# Patient Record
Sex: Female | Born: 1960 | Race: Black or African American | Hispanic: No | Smoking: Current every day smoker
Health system: Southern US, Community
[De-identification: ages and names within clinical notes are randomized; demographics above are authoritative.]

## PROBLEM LIST (undated history)

## (undated) DIAGNOSIS — G4700 Insomnia, unspecified: Secondary | ICD-10-CM

## (undated) DIAGNOSIS — Z720 Tobacco use: Secondary | ICD-10-CM

## (undated) DIAGNOSIS — E119 Type 2 diabetes mellitus without complications: Secondary | ICD-10-CM

## (undated) DIAGNOSIS — F329 Major depressive disorder, single episode, unspecified: Secondary | ICD-10-CM

## (undated) DIAGNOSIS — E785 Hyperlipidemia, unspecified: Secondary | ICD-10-CM

## (undated) HISTORY — DX: Major depressive disorder, single episode, unspecified: F32.9

## (undated) HISTORY — DX: Insomnia, unspecified: G47.00

## (undated) HISTORY — DX: Tobacco use: Z72.0

## (undated) HISTORY — PX: BREAST CYST EXCISION: SHX579

## (undated) HISTORY — DX: Hyperlipidemia, unspecified: E78.5

## (undated) HISTORY — DX: Type 2 diabetes mellitus without complications: E11.9

---

## 1985-09-21 HISTORY — PX: CHOLECYSTECTOMY: SHX55

## 2010-07-16 ENCOUNTER — Ambulatory Visit (HOSPITAL_BASED_OUTPATIENT_CLINIC_OR_DEPARTMENT_OTHER): Admission: RE | Admit: 2010-07-16 | Discharge: 2010-07-16 | Payer: Self-pay | Admitting: Unknown Physician Specialty

## 2010-07-18 ENCOUNTER — Ambulatory Visit: Payer: Self-pay | Admitting: Radiology

## 2010-10-12 ENCOUNTER — Encounter: Payer: Self-pay | Admitting: Unknown Physician Specialty

## 2012-09-21 DIAGNOSIS — F32A Depression, unspecified: Secondary | ICD-10-CM

## 2012-09-21 DIAGNOSIS — F329 Major depressive disorder, single episode, unspecified: Secondary | ICD-10-CM | POA: Insufficient documentation

## 2012-09-21 DIAGNOSIS — E785 Hyperlipidemia, unspecified: Secondary | ICD-10-CM

## 2012-09-21 DIAGNOSIS — E119 Type 2 diabetes mellitus without complications: Secondary | ICD-10-CM

## 2012-09-21 HISTORY — DX: Type 2 diabetes mellitus without complications: E11.9

## 2012-09-21 HISTORY — DX: Depression, unspecified: F32.A

## 2012-09-21 HISTORY — DX: Hyperlipidemia, unspecified: E78.5

## 2013-12-29 DIAGNOSIS — E119 Type 2 diabetes mellitus without complications: Secondary | ICD-10-CM | POA: Insufficient documentation

## 2015-09-22 HISTORY — PX: REPLACEMENT TOTAL KNEE: SUR1224

## 2017-09-03 LAB — GLUCOSE, POCT (MANUAL RESULT ENTRY): POC Glucose: 138 mg/dl — AB (ref 70–99)

## 2017-10-12 ENCOUNTER — Ambulatory Visit: Payer: Self-pay | Admitting: Internal Medicine

## 2017-10-12 ENCOUNTER — Encounter: Payer: Self-pay | Admitting: Internal Medicine

## 2017-10-12 ENCOUNTER — Ambulatory Visit: Payer: Medicaid Other | Admitting: Internal Medicine

## 2017-10-12 VITALS — BP 144/92 | HR 68 | Resp 12 | Ht 65.0 in | Wt 155.0 lb

## 2017-10-12 DIAGNOSIS — Z5321 Procedure and treatment not carried out due to patient leaving prior to being seen by health care provider: Secondary | ICD-10-CM

## 2017-10-12 LAB — GLUCOSE, POCT (MANUAL RESULT ENTRY): POC GLUCOSE: 160 mg/dL — AB (ref 70–99)

## 2017-10-12 NOTE — Progress Notes (Unsigned)
LCSW completed new patient screening with pt. Pt reported that she is often down/depressed but it is due to her physical pain, not mental health. She shared that she has problems sleeping; LCSW provided feedback about good sleep habits. Pt shared that her only current stress is wondering if the government shutdown will affect her subsidized housing. LCSW suggested calling [employer] [employer] [facility] to see if they have info about this. Pt reported that she does not have any urgent needs at this time and is not interested in counseling.

## 2017-10-12 NOTE — Progress Notes (Signed)
Patient needed to leave for another appointment before she could be seen.   Evaluated by LCSW today, however.

## 2017-10-13 ENCOUNTER — Ambulatory Visit (INDEPENDENT_AMBULATORY_CARE_PROVIDER_SITE_OTHER): Payer: Self-pay | Admitting: Internal Medicine

## 2017-10-13 ENCOUNTER — Encounter: Payer: Self-pay | Admitting: Internal Medicine

## 2017-10-13 ENCOUNTER — Ambulatory Visit: Payer: Self-pay | Admitting: Internal Medicine

## 2017-10-13 VITALS — BP 150/100 | HR 78 | Resp 12 | Ht 65.0 in | Wt 156.0 lb

## 2017-10-13 DIAGNOSIS — G4700 Insomnia, unspecified: Secondary | ICD-10-CM

## 2017-10-13 DIAGNOSIS — E119 Type 2 diabetes mellitus without complications: Secondary | ICD-10-CM

## 2017-10-13 DIAGNOSIS — Z716 Tobacco abuse counseling: Secondary | ICD-10-CM

## 2017-10-13 DIAGNOSIS — F32A Depression, unspecified: Secondary | ICD-10-CM

## 2017-10-13 DIAGNOSIS — M150 Primary generalized (osteo)arthritis: Secondary | ICD-10-CM

## 2017-10-13 DIAGNOSIS — Z720 Tobacco use: Secondary | ICD-10-CM | POA: Insufficient documentation

## 2017-10-13 DIAGNOSIS — E1142 Type 2 diabetes mellitus with diabetic polyneuropathy: Secondary | ICD-10-CM

## 2017-10-13 DIAGNOSIS — K029 Dental caries, unspecified: Secondary | ICD-10-CM

## 2017-10-13 DIAGNOSIS — R030 Elevated blood-pressure reading, without diagnosis of hypertension: Secondary | ICD-10-CM

## 2017-10-13 DIAGNOSIS — E782 Mixed hyperlipidemia: Secondary | ICD-10-CM

## 2017-10-13 DIAGNOSIS — F329 Major depressive disorder, single episode, unspecified: Secondary | ICD-10-CM

## 2017-10-13 DIAGNOSIS — M159 Polyosteoarthritis, unspecified: Secondary | ICD-10-CM

## 2017-10-13 LAB — GLUCOSE, POCT (MANUAL RESULT ENTRY): POC Glucose: 170 mg/dl — AB (ref 70–99)

## 2017-10-13 MED ORDER — VARENICLINE TARTRATE 1 MG PO TABS: ORAL_TABLET | ORAL | 1 refills | Status: DC

## 2017-10-13 MED ORDER — TRAZODONE HCL 100 MG PO TABS: 100.0000 mg | ORAL_TABLET | Freq: Every day | ORAL | 11 refills | Status: AC

## 2017-10-13 MED ORDER — METFORMIN HCL 500 MG PO TABS: 500.0000 mg | ORAL_TABLET | Freq: Two times a day (BID) | ORAL | 11 refills | Status: DC

## 2017-10-13 MED ORDER — VARENICLINE TARTRATE 0.5 MG X 11 & 1 MG X 42 PO MISC: ORAL | 0 refills | Status: DC

## 2017-10-13 MED ORDER — ATORVASTATIN CALCIUM 40 MG PO TABS: 40.0000 mg | ORAL_TABLET | Freq: Every day | ORAL | 11 refills | Status: AC

## 2017-10-13 MED ORDER — TART CHERRY ADVANCED PO CAPS: ORAL_CAPSULE | ORAL | Status: DC

## 2017-10-13 MED ORDER — GABAPENTIN 100 MG PO CAPS: ORAL_CAPSULE | ORAL | 3 refills | Status: DC

## 2017-10-13 NOTE — Progress Notes (Signed)
Subjective:    Patient ID: Sheila Padilla, female    DOB: 1961-01-16, 57 y.o.   MRN: [id number]  HPI   Here to establish  1.  Elevated BP:  No history of elevated BP.  States usually runs 120/70.  She has been in a rush today and yesterday when initially was here.  Was on low dose Lisinopril at one point for renal protection.  No history of microalbuminuria, however.  Lisinopril was stopped following surgery as bp was too low.  2.  DM:  Diagnosed about 3-4 years ago.  Has only taken Metformin 500 mg twice daily with meals.  Came off Metformin last year when worked A1C down to 5.5%.  Follow up showed A1C of 6.1% in March of 2018, but ran out of insurance at that time.  Has not had a blood glucose check or A1C since.   Weight has been fairly stable since, though weighed in 140s on Metformin.   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     3.  Diffuse OA:  On disability.    4.  Hyperlipidemia:  Diagnosed also about 3-4 years ago.  Was on a statin.  Again, has not been on medication for some time.  5.  Tobacco abuse:  Smokes 1/2 ppd.  Started age 66.  Almost stopped completely last January with Chantix before insurance ran out.    6.  Insomnia:  History of using Trazodone 100 mg in past for sleep, but when out of insurance, could not fill and poor sleep since.  7.  Right shoulder pain:  Did go to pain clinic.  8.  Pain in MCP of thumb,  left hand.  Right handed.    9.  Depression and anxiety:  Fluoxetine and Hydroxyzine.  Was stressed about her daughter being in prison short term back in 2014.  Has not taken either med for almost a year now. Not interested in counseling at this time.  No outpatient medications have been marked as taking for the 10/13/17 encounter (Office Visit) with Mack Hook, MD.    No Known Allergies   Past Medical History:  Diagnosis Date  . Depression 2014   and anxiety  . Diabetes mellitus without complication ([place]) 7510  . Hyperlipidemia  2014  . Insomnia   . Tobacco abuse age 80    Past Surgical History:  Procedure Laterality Date  . BREAST CYST EXCISION Right teen   twice in teenage years  . CHOLECYSTECTOMY  1987   open  . REPLACEMENT TOTAL KNEE Left 2017       Review of Systems     Objective:   Physical Exam  NAD HEENT:  PERRL, EOMI, discs sharp, throat without injection.  Multiple missing teeth. Neck:  Supple, No adenopathy, no thyromegaly Chest:  CTA CV:  RRR with normal S1 and S2, No S3, S4 or murmur.  No carotid bruits.  Carotid, radial and DP pulses normal and equal MS:  Hypertrophic changes of most joints of hands in particular left MCP thumb joints bilaterally  L>R        Assessment & Plan:  1.  Elevated BP:  Recheck with fasting labs in the next week.  States has never been high previously  2.  DM:  Return in next week for labs as above.  A1C, CMP, urine microalbumin/crea.  Hold on picking up Metformin until we see A1C If requires treatment, will send glucometer and test strips Rx to [facility]. Immunizations up  to date. Eye referral  3.  Hyperlipidemia:  FLP in next week.  Atorvastatin to [facility] based on lipid panel.  4.  Insomnia:  Discussed healthy sleep hygiene, including regular physical activity and exposure to sunlight regularly.  Trazodone 100 mg at bedtime.  5.  History of Depression and Anxiety.  Encouraged calling Macie Burows, LCSW, who spoke with her yesterday.  She states she does not feel the need for counseling at this time.  Hold on restarting medication until we see how she does with current prescriptions.  6.  Tobacco cessation counseling:  Chantix starter kit and continuation.  Discussed getting rid of smoking paraphernalia on day 8 and stopping.  8.  Osteoarthritis:  Diffuse.  Discussed possibility of water activity to strengthen muscles and spare joints.  Also PT.  Not currently interested in the latter.  [facility] recommended.  9. Dental Decay:   Dental referral  10.  Diabetic peripheral neuropathy:  Brings up has this with feet at end of visit.  Did well with bedtime Gabapentin 300 mg.  Titrate up to that dose.  Fasting labs in 1 week OV with me in 2 months CPE in 4 months.

## 2017-10-13 NOTE — Patient Instructions (Addendum)
Drink a glass of water before every meal Drink 6-8 glasses of water daily Eat three meals daily Eat a protein and healthy fat with every meal (eggs,fish, chicken, [place]key and limit red meats) Eat 5 servings of vegetables daily, mix the colors Eat 2 servings of fruit daily with skin, if skin is edible Use smaller plates Put food/utensils down as you chew and swallow each bite Eat at a table with friends/family at least once daily, no TV Do not eat in front of the TV  Recent studies show that people who consume all of their calories in a 12 hour period lose weight more efficiently.  For example, if you eat your first meal at 7:00 a.m., your last meal of the day should be completed by 7:00 p.m.  Tobacco Cessation:   1800QUITNOW or [id number], the former for support and possibly free nicotine patches/gum and support; the latter for [facility] Smoking cessation class. Get rid of all smoking supplies:  Cigarettes, lighters, ashtrays--no stashes just in case at home if you are serious.  [facility] [address]  [place], [place] [address] [phone]

## 2017-10-21 ENCOUNTER — Other Ambulatory Visit: Payer: Self-pay

## 2017-10-26 ENCOUNTER — Other Ambulatory Visit: Payer: Medicaid Other

## 2017-10-26 DIAGNOSIS — E782 Mixed hyperlipidemia: Secondary | ICD-10-CM

## 2017-10-26 DIAGNOSIS — E119 Type 2 diabetes mellitus without complications: Secondary | ICD-10-CM

## 2017-10-26 DIAGNOSIS — Z79899 Other long term (current) drug therapy: Secondary | ICD-10-CM

## 2017-10-27 LAB — COMPREHENSIVE METABOLIC PANEL
A/G RATIO: 1.3 (ref 1.2–2.2)
ALT: 13 IU/L (ref 0–32)
AST: 22 IU/L (ref 0–40)
Albumin: 4.2 g/dL (ref 3.5–5.5)
Alkaline Phosphatase: 111 IU/L (ref 39–117)
BUN/Creatinine Ratio: 11 (ref 9–23)
BUN: 9 mg/dL (ref 6–24)
Bilirubin Total: 0.3 mg/dL (ref 0.0–1.2)
CHLORIDE: 103 mmol/L (ref 96–106)
CO2: 21 mmol/L (ref 20–29)
Calcium: 9.8 mg/dL (ref 8.7–10.2)
Creatinine, Ser: 0.81 mg/dL (ref 0.57–1.00)
GFR calc Af Amer: 95 mL/min/{1.73_m2} (ref >59)
GFR, EST NON AFRICAN AMERICAN: 83 mL/min/{1.73_m2} (ref >59)
GLUCOSE: 154 mg/dL — AB (ref 65–99)
Globulin, Total: 3.3 g/dL (ref 1.5–4.5)
POTASSIUM: 4.2 mmol/L (ref 3.5–5.2)
Sodium: 142 mmol/L (ref 134–144)
Total Protein: 7.5 g/dL (ref 6.0–8.5)

## 2017-10-27 LAB — CBC WITH DIFFERENTIAL/PLATELET
BASOS ABS: 0 10*3/uL (ref 0.0–0.2)
BASOS: 1 %
EOS (ABSOLUTE): 0.2 10*3/uL (ref 0.0–0.4)
Eos: 2 %
Hematocrit: 40.6 % (ref 34.0–46.6)
Hemoglobin: 13.2 g/dL (ref 11.1–15.9)
IMMATURE GRANS (ABS): 0 10*3/uL (ref 0.0–0.1)
IMMATURE GRANULOCYTES: 0 %
LYMPHS: 31 %
Lymphocytes Absolute: 2 10*3/uL (ref 0.7–3.1)
MCH: 29.6 pg (ref 26.6–33.0)
MCHC: 32.5 g/dL (ref 31.5–35.7)
MCV: 91 fL (ref 79–97)
Monocytes Absolute: 0.5 10*3/uL (ref 0.1–0.9)
Monocytes: 7 %
NEUTROS PCT: 59 %
Neutrophils Absolute: 3.8 10*3/uL (ref 1.4–7.0)
PLATELETS: 248 10*3/uL (ref 150–379)
RBC: 4.46 x10E6/uL (ref 3.77–5.28)
RDW: 13.6 % (ref 12.3–15.4)
WBC: 6.4 10*3/uL (ref 3.4–10.8)

## 2017-10-27 LAB — HGB A1C W/O EAG: Hgb A1c MFr Bld: 7.1 % — ABNORMAL HIGH (ref 4.8–5.6)

## 2017-10-27 LAB — MICROALBUMIN / CREATININE URINE RATIO
CREATININE, UR: 77.7 mg/dL
Microalbumin, Urine: <3 ug/mL

## 2017-10-27 LAB — LIPID PANEL W/O CHOL/HDL RATIO
CHOLESTEROL TOTAL: 243 mg/dL — AB (ref 100–199)
HDL: 65 mg/dL (ref >39)
LDL CALC: 121 mg/dL — AB (ref 0–99)
Triglycerides: 285 mg/dL — ABNORMAL HIGH (ref 0–149)
VLDL CHOLESTEROL CAL: 57 mg/dL — AB (ref 5–40)

## 2017-10-28 ENCOUNTER — Other Ambulatory Visit: Payer: Self-pay | Admitting: Internal Medicine

## 2017-10-28 MED ORDER — GLUCOSE BLOOD VI STRP: ORAL_STRIP | 12 refills | Status: AC

## 2017-10-28 MED ORDER — AGAMATRIX ULTRA-THIN LANCETS MISC: 11 refills | Status: AC

## 2017-10-28 MED ORDER — AGAMATRIX PRESTO W/DEVICE KIT: PACK | 0 refills | Status: AC

## 2017-10-28 NOTE — Progress Notes (Signed)
Ordering glucose monitoring equipment

## 2017-11-05 ENCOUNTER — Other Ambulatory Visit: Payer: Medicaid Other | Admitting: Licensed Clinical Social Worker

## 2017-11-27 ENCOUNTER — Encounter: Payer: Self-pay | Admitting: Internal Medicine

## 2017-12-13 ENCOUNTER — Ambulatory Visit: Payer: Self-pay | Admitting: Internal Medicine

## 2017-12-27 ENCOUNTER — Other Ambulatory Visit: Payer: Self-pay

## 2017-12-29 ENCOUNTER — Ambulatory Visit: Payer: Self-pay | Admitting: Internal Medicine

## 2018-01-25 ENCOUNTER — Ambulatory Visit: Payer: Self-pay | Admitting: Internal Medicine

## 2018-02-16 ENCOUNTER — Encounter: Payer: Self-pay | Admitting: Internal Medicine

## 2018-07-02 ENCOUNTER — Other Ambulatory Visit: Payer: Self-pay | Admitting: Internal Medicine

## 2018-07-13 ENCOUNTER — Emergency Department (HOSPITAL_COMMUNITY)
Admission: EM | Admit: 2018-07-13 | Discharge: 2018-07-13 | Disposition: A | Discharge status: Home / Self Care | Payer: Medicare Other | Attending: Emergency Medicine | Admitting: Emergency Medicine

## 2018-07-13 ENCOUNTER — Emergency Department (HOSPITAL_COMMUNITY): Payer: Medicare Other

## 2018-07-13 ENCOUNTER — Other Ambulatory Visit: Payer: Self-pay

## 2018-07-13 ENCOUNTER — Encounter (HOSPITAL_COMMUNITY): Payer: Self-pay | Admitting: Emergency Medicine

## 2018-07-13 DIAGNOSIS — R1084 Generalized abdominal pain: Secondary | ICD-10-CM

## 2018-07-13 DIAGNOSIS — E119 Type 2 diabetes mellitus without complications: Secondary | ICD-10-CM | POA: Insufficient documentation

## 2018-07-13 DIAGNOSIS — Z79899 Other long term (current) drug therapy: Secondary | ICD-10-CM | POA: Diagnosis not present

## 2018-07-13 DIAGNOSIS — N390 Urinary tract infection, site not specified: Secondary | ICD-10-CM

## 2018-07-13 DIAGNOSIS — F17210 Nicotine dependence, cigarettes, uncomplicated: Secondary | ICD-10-CM | POA: Insufficient documentation

## 2018-07-13 DIAGNOSIS — Z7984 Long term (current) use of oral hypoglycemic drugs: Secondary | ICD-10-CM | POA: Insufficient documentation

## 2018-07-13 LAB — URINALYSIS, ROUTINE W REFLEX MICROSCOPIC
Bilirubin Urine: NEGATIVE
Glucose, UA: NEGATIVE mg/dL
Hgb urine dipstick: NEGATIVE
Ketones, ur: 20 mg/dL — AB
Leukocytes, UA: NEGATIVE
Nitrite: POSITIVE — AB
PH: 6 (ref 5.0–8.0)
PROTEIN: NEGATIVE mg/dL
SPECIFIC GRAVITY, URINE: 1.018 (ref 1.005–1.030)

## 2018-07-13 LAB — COMPREHENSIVE METABOLIC PANEL
ALT: 18 U/L (ref 0–44)
AST: 22 U/L (ref 15–41)
Albumin: 4.5 g/dL (ref 3.5–5.0)
Alkaline Phosphatase: 73 U/L (ref 38–126)
Anion gap: 15 (ref 5–15)
BUN: 14 mg/dL (ref 6–20)
CHLORIDE: 97 mmol/L — AB (ref 98–111)
CO2: 25 mmol/L (ref 22–32)
Calcium: 9.9 mg/dL (ref 8.9–10.3)
Creatinine, Ser: 0.95 mg/dL (ref 0.44–1.00)
GFR calc Af Amer: >60 mL/min (ref >60)
GFR calc non Af Amer: >60 mL/min (ref >60)
GLUCOSE: 191 mg/dL — AB (ref 70–99)
Potassium: 3.5 mmol/L (ref 3.5–5.1)
Sodium: 137 mmol/L (ref 135–145)
Total Bilirubin: 0.9 mg/dL (ref 0.3–1.2)
Total Protein: 8.8 g/dL — ABNORMAL HIGH (ref 6.5–8.1)

## 2018-07-13 LAB — CBC
HEMATOCRIT: 45.1 % (ref 36.0–46.0)
Hemoglobin: 14.4 g/dL (ref 12.0–15.0)
MCH: 29.8 pg (ref 26.0–34.0)
MCHC: 31.9 g/dL (ref 30.0–36.0)
MCV: 93.2 fL (ref 80.0–100.0)
Platelets: 294 10*3/uL (ref 150–400)
RBC: 4.84 MIL/uL (ref 3.87–5.11)
RDW: 12.3 % (ref 11.5–15.5)
WBC: 14.1 10*3/uL — ABNORMAL HIGH (ref 4.0–10.5)
nRBC: 0 % (ref 0.0–0.2)

## 2018-07-13 LAB — LIPASE, BLOOD: LIPASE: 28 U/L (ref 11–51)

## 2018-07-13 MED ORDER — ONDANSETRON HCL 4 MG/2ML IJ SOLN
4.0000 mg | Freq: Once | INTRAMUSCULAR | Status: AC
  Administered 2018-07-13: 4 mg via INTRAVENOUS
  Filled 2018-07-13: qty 2

## 2018-07-13 MED ORDER — CEPHALEXIN 500 MG PO CAPS: 500.0000 mg | ORAL_CAPSULE | Freq: Two times a day (BID) | ORAL | 0 refills | Status: DC

## 2018-07-13 MED ORDER — HYDROMORPHONE HCL 1 MG/ML IJ SOLN
1.0000 mg | Freq: Once | INTRAMUSCULAR | Status: AC
  Administered 2018-07-13: 1 mg via INTRAVENOUS
  Filled 2018-07-13: qty 1

## 2018-07-13 MED ORDER — MORPHINE SULFATE (PF) 4 MG/ML IV SOLN
4.0000 mg | Freq: Once | INTRAVENOUS | Status: AC
  Administered 2018-07-13: 4 mg via INTRAVENOUS
  Filled 2018-07-13: qty 1

## 2018-07-13 MED ORDER — SODIUM CHLORIDE 0.9 % IV BOLUS
1000.0000 mL | Freq: Once | INTRAVENOUS | Status: AC
  Administered 2018-07-13: 1000 mL via INTRAVENOUS

## 2018-07-13 MED ORDER — SODIUM CHLORIDE 0.9 % IJ SOLN
INTRAMUSCULAR | Status: AC
  Filled 2018-07-13: qty 50

## 2018-07-13 MED ORDER — IOPAMIDOL (ISOVUE-300) INJECTION 61%
100.0000 mL | Freq: Once | INTRAVENOUS | Status: AC | PRN
  Administered 2018-07-13: 100 mL via INTRAVENOUS

## 2018-07-13 MED ORDER — SODIUM CHLORIDE 0.9 % IV SOLN
1.0000 g | Freq: Once | INTRAVENOUS | Status: AC
  Administered 2018-07-13: 1 g via INTRAVENOUS
  Filled 2018-07-13: qty 10

## 2018-07-13 MED ORDER — IOPAMIDOL (ISOVUE-300) INJECTION 61%
INTRAVENOUS | Status: AC
  Filled 2018-07-13: qty 100

## 2018-07-13 MED ORDER — ONDANSETRON 4 MG PO TBDP: 4.0000 mg | ORAL_TABLET | Freq: Three times a day (TID) | ORAL | 0 refills | Status: DC | PRN

## 2018-07-13 NOTE — ED Notes (Signed)
Pt given graham crackers and Ginger ale, encouraged to eat/drink.

## 2018-07-13 NOTE — ED Triage Notes (Signed)
Pt complaint of body aches, n/v/d, cough onset this morning. Concern "sick or withdrawal from my oxycodone." Pt verbalizes prescribed oxycodone every 6 hours for past 2 months; last dose yesterday evening.

## 2018-07-13 NOTE — ED Provider Notes (Signed)
[facility] DEPT Provider Note   CSN: [id number] Arrival date & time: 07/13/18  1202     History   Chief Complaint Chief Complaint  Patient presents with  . Emesis  . Diarrhea  . Generalized Body Aches  . Cough    HPI Sheila Padilla is a 57 y.o. female.  The history is provided by the patient. No language interpreter was used.   Sheila Padilla is a 57 y.o. female who presents to the Emergency Department complaining of abdominal pain, vomiting. He presents to the emergency department for evaluation of abdominal pain and vomiting that started today. She began feeling poorly last night with nausea and malaise. This morning when she awoke she had generalized abdominal pain with numerous episodes of emesis. She did have to loose stools. She has a history of chronic pain and has been taking Percocet 5, 3 to 4 times daily for the last two months. Her last dose of Percocet was 2 PM yesterday. Her refill is not due until tomorrow. She denies any fevers but does feel hot at times. No dysuria. No known sick contacts or bad food exposures. Past Medical History:  Diagnosis Date  . Depression 2014   and anxiety  . Diabetes mellitus without complication ([place]) 1552  . Hyperlipidemia 2014  . Insomnia   . Tobacco abuse age 34    Patient Active Problem List   Diagnosis Date Noted  . Insomnia   . Tobacco abuse   . Type II diabetes mellitus ([place]) 12/29/2013  . Diabetes mellitus without complication ([place]) 04/23/2335  . Hyperlipidemia 09/21/2012  . Depression 09/21/2012    Past Surgical History:  Procedure Laterality Date  . BREAST CYST EXCISION Right teen   twice in teenage years  . CHOLECYSTECTOMY  1987   open  . REPLACEMENT TOTAL KNEE Left 2017     OB History   None      Home Medications    Prior to Admission medications   Medication Sig Start Date End Date Taking? Authorizing Provider  atorvastatin (LIPITOR) 40 MG tablet Take 1 tablet (40 mg  total) by mouth daily. 10/13/17  Yes Mack Hook, MD  celecoxib (CELEBREX) 200 MG capsule Take 200 mg by mouth daily as needed for mild pain.  07/12/18  Yes [provider]  gabapentin (NEURONTIN) 300 MG capsule Take 600 mg by mouth at bedtime as needed (nerve pain).  07/07/18  Yes [provider]  hydrOXYzine (ATARAX/VISTARIL) 25 MG tablet Take 25 mg by mouth every 8 (eight) hours as needed for anxiety.  07/05/18  Yes [provider]  metFORMIN (GLUCOPHAGE) 500 MG tablet TAKE 1 TABLET BY MOUTH TWICE DAILY WITH A MEAL Patient taking differently: Take 500 mg by mouth 2 (two) times daily with a meal.  07/03/18  Yes Mack Hook, MD  omeprazole (PRILOSEC) 20 MG capsule Take 20 mg by mouth daily as needed (heartburn).  07/12/18  Yes [provider]  oxyCODONE-acetaminophen (PERCOCET/ROXICET) 5-325 MG tablet Take 1 tablet by mouth every 12 (twelve) hours as needed for moderate pain.  05/19/18  Yes [provider]  traZODone (DESYREL) 100 MG tablet Take 1 tablet (100 mg total) by mouth at bedtime. 10/13/17  Yes Mack Hook, MD  Turmeric 500 MG TABS Take 1,000 mg by mouth daily.   Yes [provider]  Leandra Kern LANCETS MISC Check blood glucose twice daily before meals 10/28/17   Mack Hook, MD  Blood Glucose Monitoring Suppl (AGAMATRIX PRESTO) w/Device KIT Check  blood glucose twice daily before meals 10/28/17   Mack Hook, MD  gabapentin (NEURONTIN) 100 MG capsule 1 cap by mouth at bedtime and increased by 1 cap every 3 days until taking 3 caps at bedtime. Patient not taking: Reported on 07/13/2018 10/13/17   Mack Hook, MD  glucose blood (AGAMATRIX PRESTO TEST) test strip Check blood glucose twice daily before meals 10/28/17   Mack Hook, MD  Misc Natural Products [facility] - Eat ADVANCED) CAPS Take as directed by [facility] for arthritis pain Patient not taking: Reported on 07/13/2018  10/13/17   Mack Hook, MD  varenicline (CHANTIX STARTING MONTH PAK) 0.5 MG X 11 & 1 MG X 42 tablet Take one 0.5 mg tablet by mouth once daily for 3 days, then increase to one 0.5 mg tablet twice daily for 4 days, then increase to one 1 mg tablet twice daily. Patient not taking: Reported on 07/13/2018 10/13/17   Mack Hook, MD  varenicline (CHANTIX) 1 MG tablet 1 tab by mouth twice daily after completing starter pack Patient not taking: Reported on 07/13/2018 10/13/17   Mack Hook, MD    Family History No family history on file.  Social History Social History   Tobacco Use  . Smoking status: Current Every Day Smoker    Packs/day: 0.50    Years: 38.00    Pack years: 19.00    Types: Cigarettes  . Smokeless tobacco: Never Used  Substance Use Topics  . Alcohol use: Not on file  . Drug use: Not on file     Allergies   Patient has no known allergies.   Review of Systems Review of Systems  All other systems reviewed and are negative.    Physical Exam Updated Vital Signs BP (!) 153/87   Pulse (!) 104   Temp 98 F (36.7 C) (Oral)   Resp 18   LMP  (LMP Unknown)   SpO2 100%   Physical Exam  Constitutional: She is oriented to person, place, and time. She appears well-developed and well-nourished.  Uncomfortable appearing  HENT:  Head: Normocephalic and atraumatic.  Cardiovascular: Normal rate and regular rhythm.  No murmur heard. Pulmonary/Chest: Effort normal and breath sounds normal. No respiratory distress.  Abdominal: Soft. There is no rebound and no guarding.  Mild generalized abdominal tenderness, greatest over the lower quadrant bilaterally  Musculoskeletal: She exhibits no edema or tenderness.  Neurological: She is alert and oriented to person, place, and time.  Skin: Skin is warm and dry.  Psychiatric: She has a normal mood and affect. Her behavior is normal.  Nursing note and vitals reviewed.    ED Treatments / Results  Labs (all  labs ordered are listed, but only abnormal results are displayed) Labs Reviewed  COMPREHENSIVE METABOLIC PANEL - Abnormal; Notable for the following components:      Result Value   Chloride 97 (*)    Glucose, Bld 191 (*)    Total Protein 8.8 (*)    All other components within normal limits  CBC - Abnormal; Notable for the following components:   WBC 14.1 (*)    All other components within normal limits  URINALYSIS, ROUTINE W REFLEX MICROSCOPIC - Abnormal; Notable for the following components:   APPearance HAZY (*)    Ketones, ur 20 (*)    Nitrite POSITIVE (*)    Bacteria, UA MANY (*)    All other components within normal limits  URINE CULTURE  LIPASE, BLOOD    EKG None  Radiology Ct Abdomen Pelvis  W Contrast  Result Date: 07/13/2018 CLINICAL DATA:  Generalized abdominal pain with fever nausea and vomiting EXAM: CT ABDOMEN AND PELVIS WITH CONTRAST TECHNIQUE: Multidetector CT imaging of the abdomen and pelvis was performed using the standard protocol following bolus administration of intravenous contrast. CONTRAST:  136m ISOVUE-300 IOPAMIDOL (ISOVUE-300) INJECTION 61% COMPARISON:  09/05/2015 FINDINGS: Lower chest: Lung bases demonstrate no acute consolidation or effusion. Heart size is normal. Hepatobiliary: No focal liver abnormality is seen. Status post cholecystectomy. No biliary dilatation. Pancreas: Unremarkable. No pancreatic ductal dilatation or surrounding inflammatory changes. Spleen: Normal in size without focal abnormality. Adrenals/Urinary Tract: Adrenal glands are unremarkable. Kidneys are normal, without renal calculi, focal lesion, or hydronephrosis. Bladder is unremarkable. Stomach/Bowel: Stomach is within normal limits. Appendix not well seen but no right lower quadrant inflammatory process. No evidence of bowel wall thickening, distention, or inflammatory changes. Sigmoid colon diverticular disease without acute inflammatory process. Vascular/Lymphatic: No significant  vascular findings are present. No enlarged abdominal or pelvic lymph nodes. Reproductive: Status post hysterectomy. No adnexal masses. Other: Negative for free air or free fluid. Musculoskeletal: Grade 1 anterolisthesis L4 on L5. No acute or suspicious abnormality. IMPRESSION: 1. No CT evidence for acute intra-abdominal or pelvic abnormality. 2. Sigmoid colon diverticular disease without acute inflammation Electronically Signed   By: KDonavan FoilM.D.   On: 07/13/2018 15:21    Procedures Procedures (including critical care time)  Medications Ordered in ED Medications  iopamidol (ISOVUE-300) 61 % injection (has no administration in time range)  sodium chloride 0.9 % injection (has no administration in time range)  cefTRIAXone (ROCEPHIN) 1 g in sodium chloride 0.9 % 100 mL IVPB (1 g Intravenous New Bag/Given 07/13/18 1640)  sodium chloride 0.9 % bolus 1,000 mL (0 mLs Intravenous Stopped 07/13/18 1548)  ondansetron (ZOFRAN) injection 4 mg (4 mg Intravenous Given 07/13/18 1358)  morphine 4 MG/ML injection 4 mg (4 mg Intravenous Given 07/13/18 1359)  iopamidol (ISOVUE-300) 61 % injection 100 mL (100 mLs Intravenous Contrast Given 07/13/18 1450)  HYDROmorphone (DILAUDID) injection 1 mg (1 mg Intravenous Given 07/13/18 1635)  ondansetron (ZOFRAN) injection 4 mg (4 mg Intravenous Given 07/13/18 1634)     Initial Impression / Assessment and Plan / ED Course  I have reviewed the triage vital signs and the nursing notes.  Pertinent labs & imaging results that were available during my care of the patient were reviewed by me and considered in my medical decision making (see chart for details).     Patient here for evaluation of abdominal pain, nausea, vomiting. She does have a urinalysis that is concerning for UTI. Will treat with antibiotics in setting of her symptoms. She recently ran out of her Percocet, there may be an element of narcotic withdrawal as well. She was treated with IV pain  medications, antiemetics and IV fluids. CT abdomen is negative for acute pathology. If patient is feeling improved following medications plan to discharge home with outpatient follow-up. If she has persistent vomiting she may need admission for IV antibiotics for UTI. Patient care transferred pending medications.  Final Clinical Impressions(s) / ED Diagnoses   Final diagnoses:  None    ED Discharge Orders    None       RQuintella Reichert MD 07/13/18 [phone]

## 2018-07-13 NOTE — ED Notes (Signed)
Pt tolerating food and fluids, no complaints.

## 2018-07-15 ENCOUNTER — Emergency Department (HOSPITAL_COMMUNITY)
Admission: EM | Admit: 2018-07-15 | Discharge: 2018-07-15 | Disposition: A | Discharge status: Home / Self Care | Payer: Medicare Other | Attending: Emergency Medicine | Admitting: Emergency Medicine

## 2018-07-15 ENCOUNTER — Encounter (HOSPITAL_COMMUNITY): Payer: Self-pay

## 2018-07-15 DIAGNOSIS — F17210 Nicotine dependence, cigarettes, uncomplicated: Secondary | ICD-10-CM | POA: Insufficient documentation

## 2018-07-15 DIAGNOSIS — Z7984 Long term (current) use of oral hypoglycemic drugs: Secondary | ICD-10-CM | POA: Insufficient documentation

## 2018-07-15 DIAGNOSIS — N390 Urinary tract infection, site not specified: Secondary | ICD-10-CM | POA: Diagnosis not present

## 2018-07-15 DIAGNOSIS — R112 Nausea with vomiting, unspecified: Secondary | ICD-10-CM | POA: Insufficient documentation

## 2018-07-15 DIAGNOSIS — Z79899 Other long term (current) drug therapy: Secondary | ICD-10-CM | POA: Diagnosis not present

## 2018-07-15 DIAGNOSIS — E119 Type 2 diabetes mellitus without complications: Secondary | ICD-10-CM | POA: Diagnosis not present

## 2018-07-15 DIAGNOSIS — Z96652 Presence of left artificial knee joint: Secondary | ICD-10-CM | POA: Insufficient documentation

## 2018-07-15 DIAGNOSIS — R1010 Upper abdominal pain, unspecified: Secondary | ICD-10-CM | POA: Diagnosis present

## 2018-07-15 LAB — CBC WITH DIFFERENTIAL/PLATELET
ABS IMMATURE GRANULOCYTES: 0.04 10*3/uL (ref 0.00–0.07)
BASOS PCT: 0 %
Basophils Absolute: 0 10*3/uL (ref 0.0–0.1)
Eosinophils Absolute: 0 10*3/uL (ref 0.0–0.5)
Eosinophils Relative: 0 %
HCT: 42.2 % (ref 36.0–46.0)
Hemoglobin: 13.9 g/dL (ref 12.0–15.0)
IMMATURE GRANULOCYTES: 0 %
Lymphocytes Relative: 22 %
Lymphs Abs: 2 10*3/uL (ref 0.7–4.0)
MCH: 29.8 pg (ref 26.0–34.0)
MCHC: 32.9 g/dL (ref 30.0–36.0)
MCV: 90.6 fL (ref 80.0–100.0)
Monocytes Absolute: 0.7 10*3/uL (ref 0.1–1.0)
Monocytes Relative: 8 %
NEUTROS ABS: 6.3 10*3/uL (ref 1.7–7.7)
NEUTROS PCT: 70 %
PLATELETS: 264 10*3/uL (ref 150–400)
RBC: 4.66 MIL/uL (ref 3.87–5.11)
RDW: 12.6 % (ref 11.5–15.5)
WBC: 9 10*3/uL (ref 4.0–10.5)
nRBC: 0 % (ref 0.0–0.2)

## 2018-07-15 LAB — BASIC METABOLIC PANEL
ANION GAP: 14 (ref 5–15)
BUN: 13 mg/dL (ref 6–20)
CALCIUM: 10 mg/dL (ref 8.9–10.3)
CO2: 21 mmol/L — ABNORMAL LOW (ref 22–32)
CREATININE: 0.79 mg/dL (ref 0.44–1.00)
Chloride: 101 mmol/L (ref 98–111)
GFR calc Af Amer: >60 mL/min (ref >60)
GLUCOSE: 132 mg/dL — AB (ref 70–99)
Potassium: 3.7 mmol/L (ref 3.5–5.1)
SODIUM: 136 mmol/L (ref 135–145)

## 2018-07-15 LAB — URINE CULTURE: Culture: >=100000 — AB

## 2018-07-15 MED ORDER — DIPHENHYDRAMINE HCL 50 MG/ML IJ SOLN
25.0000 mg | Freq: Once | INTRAMUSCULAR | Status: AC
  Administered 2018-07-15: 25 mg via INTRAVENOUS
  Filled 2018-07-15: qty 1

## 2018-07-15 MED ORDER — ONDANSETRON HCL 4 MG/2ML IJ SOLN
4.0000 mg | Freq: Once | INTRAMUSCULAR | Status: AC
  Administered 2018-07-15: 4 mg via INTRAVENOUS
  Filled 2018-07-15: qty 2

## 2018-07-15 MED ORDER — SODIUM CHLORIDE 0.9 % IV SOLN
1.0000 g | Freq: Once | INTRAVENOUS | Status: AC
  Administered 2018-07-15: 1 g via INTRAVENOUS
  Filled 2018-07-15: qty 10

## 2018-07-15 MED ORDER — HYDROMORPHONE HCL 1 MG/ML IJ SOLN
1.0000 mg | Freq: Once | INTRAMUSCULAR | Status: AC
  Administered 2018-07-15: 1 mg via INTRAVENOUS
  Filled 2018-07-15: qty 1

## 2018-07-15 MED ORDER — PROMETHAZINE HCL 25 MG RE SUPP: 25.0000 mg | Freq: Four times a day (QID) | RECTAL | 0 refills | Status: DC | PRN

## 2018-07-15 MED ORDER — SODIUM CHLORIDE 0.9 % IV BOLUS
1000.0000 mL | Freq: Once | INTRAVENOUS | Status: AC
  Administered 2018-07-15: 1000 mL via INTRAVENOUS

## 2018-07-15 MED ORDER — METOCLOPRAMIDE HCL 5 MG/ML IJ SOLN
10.0000 mg | Freq: Once | INTRAMUSCULAR | Status: AC
  Administered 2018-07-15: 10 mg via INTRAVENOUS
  Filled 2018-07-15: qty 2

## 2018-07-15 MED ORDER — METOCLOPRAMIDE HCL 10 MG PO TABS: 10.0000 mg | ORAL_TABLET | Freq: Four times a day (QID) | ORAL | 0 refills | Status: DC | PRN

## 2018-07-15 NOTE — ED Triage Notes (Signed)
Pt arrived from home via [facility] with complaints of lower abdominal pain. Pt evaluated here and diagnosed with a UTI. Pt states has had no relief and constant nausea.

## 2018-07-15 NOTE — ED Provider Notes (Signed)
[facility] DEPT Provider Note   CSN: [id number] Arrival date & time: 07/15/18  2248     History   Chief Complaint Chief Complaint  Patient presents with  . Urinary Tract Infection    HPI Sheila Padilla is a 57 y.o. female.  Patient presents with continued abdominal pain with nausea and vomiting today.  She was seen in the ER yesterday with similar complaints.  She did have diarrhea at that time as well, reports that the diarrhea has stopped.  Patient was out of her oxycodone yesterday, but did get a prescribed today and has taken it.  It has not helped the pain.  Patient reports that she has intermittent spasms of pain across the upper abdomen, like labor pains.  She reports that she burps first and then vomits.     Past Medical History:  Diagnosis Date  . Depression 2014   and anxiety  . Diabetes mellitus without complication ([place]) 2500  . Hyperlipidemia 2014  . Insomnia   . Tobacco abuse age 64    Patient Active Problem List   Diagnosis Date Noted  . Insomnia   . Tobacco abuse   . Type II diabetes mellitus ([place]) 12/29/2013  . Diabetes mellitus without complication ([place]) 37/12/8887  . Hyperlipidemia 09/21/2012  . Depression 09/21/2012    Past Surgical History:  Procedure Laterality Date  . BREAST CYST EXCISION Right teen   twice in teenage years  . CHOLECYSTECTOMY  1987   open  . REPLACEMENT TOTAL KNEE Left 2017     OB History   None      Home Medications    Prior to Admission medications   Medication Sig Start Date End Date Taking? Authorizing Provider  atorvastatin (LIPITOR) 40 MG tablet Take 1 tablet (40 mg total) by mouth daily. 10/13/17  Yes Mack Hook, MD  celecoxib (CELEBREX) 200 MG capsule Take 200 mg by mouth daily as needed for mild pain.  07/12/18  Yes [provider]  cephALEXin (KEFLEX) 500 MG capsule Take 1 capsule (500 mg total) by mouth 2 (two) times daily. 07/13/18  Yes Quintella Reichert,  MD  gabapentin (NEURONTIN) 300 MG capsule Take 600 mg by mouth at bedtime as needed (nerve pain).  07/07/18  Yes [provider]  hydrOXYzine (ATARAX/VISTARIL) 25 MG tablet Take 25 mg by mouth every 8 (eight) hours as needed for anxiety.  07/05/18  Yes [provider]  metFORMIN (GLUCOPHAGE) 500 MG tablet TAKE 1 TABLET BY MOUTH TWICE DAILY WITH A MEAL Patient taking differently: Take 500 mg by mouth 2 (two) times daily with a meal.  07/03/18  Yes Mack Hook, MD  omeprazole (PRILOSEC) 20 MG capsule Take 20 mg by mouth daily as needed (heartburn).  07/12/18  Yes [provider]  ondansetron (ZOFRAN ODT) 4 MG disintegrating tablet Take 1 tablet (4 mg total) by mouth every 8 (eight) hours as needed for nausea or vomiting. 07/13/18  Yes Quintella Reichert, MD  oxyCODONE-acetaminophen (PERCOCET/ROXICET) 5-325 MG tablet Take 1 tablet by mouth every 12 (twelve) hours as needed for moderate pain.  05/19/18  Yes [provider]  traZODone (DESYREL) 100 MG tablet Take 1 tablet (100 mg total) by mouth at bedtime. 10/13/17  Yes Mack Hook, MD  Turmeric 500 MG TABS Take 1,000 mg by mouth daily.   Yes [provider]  Leandra Kern LANCETS MISC Check blood glucose twice daily before meals 10/28/17   Mack Hook, MD  Blood Glucose Monitoring Suppl (AGAMATRIX PRESTO)  w/Device KIT Check blood glucose twice daily before meals 10/28/17   Mack Hook, MD  gabapentin (NEURONTIN) 100 MG capsule 1 cap by mouth at bedtime and increased by 1 cap every 3 days until taking 3 caps at bedtime. Patient not taking: Reported on 07/13/2018 10/13/17   Mack Hook, MD  glucose blood (AGAMATRIX PRESTO TEST) test strip Check blood glucose twice daily before meals 10/28/17   Mack Hook, MD  metoCLOPramide (REGLAN) 10 MG tablet Take 1 tablet (10 mg total) by mouth every 6 (six) hours as needed for nausea or vomiting (nausea/headache). 07/15/18    Orpah Greek, MD  Misc Natural Products [facility] ADVANCED) CAPS Take as directed by [employer] for arthritis pain Patient not taking: Reported on 07/13/2018 10/13/17   Mack Hook, MD  promethazine (PHENERGAN) 25 MG suppository Place 1 suppository (25 mg total) rectally every 6 (six) hours as needed for nausea or vomiting. 07/15/18   Orpah Greek, MD  varenicline (CHANTIX STARTING MONTH PAK) 0.5 MG X 11 & 1 MG X 42 tablet Take one 0.5 mg tablet by mouth once daily for 3 days, then increase to one 0.5 mg tablet twice daily for 4 days, then increase to one 1 mg tablet twice daily. Patient not taking: Reported on 07/13/2018 10/13/17   Mack Hook, MD  varenicline (CHANTIX) 1 MG tablet 1 tab by mouth twice daily after completing starter pack Patient not taking: Reported on 07/13/2018 10/13/17   Mack Hook, MD    Family History No family history on file.  Social History Social History   Tobacco Use  . Smoking status: Current Every Day Smoker    Packs/day: 0.50    Years: 38.00    Pack years: 19.00    Types: Cigarettes  . Smokeless tobacco: Never Used  Substance Use Topics  . Alcohol use: Not on file  . Drug use: Not on file     Allergies   Patient has no known allergies.   Review of Systems Review of Systems  Gastrointestinal: Positive for abdominal pain, nausea and vomiting.  All other systems reviewed and are negative.    Physical Exam Updated Vital Signs BP (!) 157/84 (BP Location: Left Arm)   Pulse 82   Temp 98.3 F (36.8 C) (Oral)   Resp 18   LMP  (LMP Unknown)   SpO2 100%   Physical Exam  Constitutional: She is oriented to person, place, and time. She appears well-developed and well-nourished. No distress.  HENT:  Head: Normocephalic and atraumatic.  Right Ear: Hearing normal.  Left Ear: Hearing normal.  Nose: Nose normal.  Mouth/Throat: Oropharynx is clear and moist and mucous membranes are normal.    Eyes: Pupils are equal, round, and reactive to light. Conjunctivae and EOM are normal.  Neck: Normal range of motion. Neck supple.  Cardiovascular: Regular rhythm, S1 normal and S2 normal. Exam reveals no gallop and no friction rub.  No murmur heard. Pulmonary/Chest: Effort normal and breath sounds normal. No respiratory distress. She exhibits no tenderness.  Abdominal: Soft. Normal appearance and bowel sounds are normal. There is no hepatosplenomegaly. There is tenderness in the right upper quadrant, epigastric area, periumbilical area and left upper quadrant. There is no rebound, no guarding, no tenderness at McBurney's point and negative Murphy's sign. No hernia.  Musculoskeletal: Normal range of motion.  Neurological: She is alert and oriented to person, place, and time. She has normal strength. No cranial nerve deficit or sensory deficit. Coordination normal. GCS eye subscore  is 4. GCS verbal subscore is 5. GCS motor subscore is 6.  Skin: Skin is warm, dry and intact. No rash noted. No cyanosis.  Psychiatric: She has a normal mood and affect. Her speech is normal and behavior is normal. Thought content normal.  Nursing note and vitals reviewed.    ED Treatments / Results  Labs (all labs ordered are listed, but only abnormal results are displayed) Labs Reviewed  BASIC METABOLIC PANEL - Abnormal; Notable for the following components:      Result Value   CO2 21 (*)    Glucose, Bld 132 (*)    All other components within normal limits  CBC WITH DIFFERENTIAL/PLATELET  URINALYSIS, ROUTINE W REFLEX MICROSCOPIC  RAPID URINE DRUG SCREEN, HOSP PERFORMED    EKG None  Radiology Ct Abdomen Pelvis W Contrast  Result Date: 07/13/2018 CLINICAL DATA:  Generalized abdominal pain with fever nausea and vomiting EXAM: CT ABDOMEN AND PELVIS WITH CONTRAST TECHNIQUE: Multidetector CT imaging of the abdomen and pelvis was performed using the standard protocol following bolus administration of  intravenous contrast. CONTRAST:  134m ISOVUE-300 IOPAMIDOL (ISOVUE-300) INJECTION 61% COMPARISON:  09/05/2015 FINDINGS: Lower chest: Lung bases demonstrate no acute consolidation or effusion. Heart size is normal. Hepatobiliary: No focal liver abnormality is seen. Status post cholecystectomy. No biliary dilatation. Pancreas: Unremarkable. No pancreatic ductal dilatation or surrounding inflammatory changes. Spleen: Normal in size without focal abnormality. Adrenals/Urinary Tract: Adrenal glands are unremarkable. Kidneys are normal, without renal calculi, focal lesion, or hydronephrosis. Bladder is unremarkable. Stomach/Bowel: Stomach is within normal limits. Appendix not well seen but no right lower quadrant inflammatory process. No evidence of bowel wall thickening, distention, or inflammatory changes. Sigmoid colon diverticular disease without acute inflammatory process. Vascular/Lymphatic: No significant vascular findings are present. No enlarged abdominal or pelvic lymph nodes. Reproductive: Status post hysterectomy. No adnexal masses. Other: Negative for free air or free fluid. Musculoskeletal: Grade 1 anterolisthesis L4 on L5. No acute or suspicious abnormality. IMPRESSION: 1. No CT evidence for acute intra-abdominal or pelvic abnormality. 2. Sigmoid colon diverticular disease without acute inflammation Electronically Signed   By: KDonavan FoilM.D.   On: 07/13/2018 15:21    Procedures Procedures (including critical care time)  Medications Ordered in ED Medications  sodium chloride 0.9 % bolus 1,000 mL (0 mLs Intravenous Stopped 07/15/18 0533)  ondansetron (ZOFRAN) injection 4 mg (4 mg Intravenous Given 07/15/18 0432)  metoCLOPramide (REGLAN) injection 10 mg (10 mg Intravenous Given 07/15/18 0433)  diphenhydrAMINE (BENADRYL) injection 25 mg (25 mg Intravenous Given 07/15/18 0433)  cefTRIAXone (ROCEPHIN) 1 g in sodium chloride 0.9 % 100 mL IVPB (0 g Intravenous Stopped 07/15/18 0523)     Initial  Impression / Assessment and Plan / ED Course  I have reviewed the triage vital signs and the nursing notes.  Pertinent labs & imaging results that were available during my care of the patient were reviewed by me and considered in my medical decision making (see chart for details).     Patient presents to the emergency department for evaluation of persistent abdominal pain, nausea and vomiting.  She was seen in the emergency department 1 day ago with similar symptoms.  Her work-up revealed evidence of urinary tract infection, but otherwise was negative.  This included a CT scan.  She therefore does not require repeat imaging.  Patient's urine culture is growing E. coli, sensitivities still pending.  She was given an additional IV Rocephin here in the ER.  I believe that her vomiting, however, is  multifactorial.  She is not experiencing any back pain, does not have any CVA tenderness.  I do not think that this is pyelonephritis.  Patient was without her pain medication for 1 day which began with nausea and vomiting.  She also has a history of diabetes, likely some gastroparesis.  Patient provided IV fluids, antiemetics, analgesia and has significantly improved.  I do not feel that the patient requires hospitalization at this time.  Final Clinical Impressions(s) / ED Diagnoses   Final diagnoses:  Lower urinary tract infectious disease  Non-intractable vomiting with nausea, unspecified vomiting type    ED Discharge Orders         Ordered    metoCLOPramide (REGLAN) 10 MG tablet  Every 6 hours PRN     07/15/18 0615    promethazine (PHENERGAN) 25 MG suppository  Every 6 hours PRN     07/15/18 0615           Orpah Greek, MD 07/15/18 [phone]

## 2018-07-16 ENCOUNTER — Telehealth: Payer: Self-pay

## 2018-07-16 NOTE — Telephone Encounter (Signed)
Post ED Visit - Positive Culture Follow-up  Culture report reviewed by antimicrobial stewardship pharmacist:  []  Enzo Bi, Pharm.D. []  Celedonio Miyamoto, Pharm.D., BCPS AQ-ID []  Garvin Fila, Pharm.D., BCPS []  Georgina Pillion, Pharm.D., BCPS []  [place], [place].D., BCPS, AAHIVP []  Estella Husk, Pharm.D., BCPS, AAHIVP []  Lysle Pearl, PharmD, BCPS []  Phillips Climes, PharmD, BCPS []  Agapito Games, PharmD, BCPS []  Verlan Friends, PharmD [facility] Pharm D Positive urine culture Treated with Cephalexin, organism sensitive to the same and no further patient follow-up is required at this time.  Jerry Caras 07/16/2018, 11:06 AM

## 2018-08-08 ENCOUNTER — Other Ambulatory Visit: Payer: Self-pay

## 2018-08-08 ENCOUNTER — Encounter (HOSPITAL_COMMUNITY): Payer: Self-pay | Admitting: Emergency Medicine

## 2018-08-08 ENCOUNTER — Emergency Department (HOSPITAL_COMMUNITY): Payer: Medicare Other

## 2018-08-08 ENCOUNTER — Emergency Department (HOSPITAL_COMMUNITY)
Admission: EM | Admit: 2018-08-08 | Discharge: 2018-08-08 | Disposition: A | Discharge status: Home / Self Care | Payer: Medicare Other | Attending: Emergency Medicine | Admitting: Emergency Medicine

## 2018-08-08 DIAGNOSIS — Z79899 Other long term (current) drug therapy: Secondary | ICD-10-CM | POA: Insufficient documentation

## 2018-08-08 DIAGNOSIS — F17210 Nicotine dependence, cigarettes, uncomplicated: Secondary | ICD-10-CM | POA: Insufficient documentation

## 2018-08-08 DIAGNOSIS — N3000 Acute cystitis without hematuria: Secondary | ICD-10-CM | POA: Diagnosis not present

## 2018-08-08 DIAGNOSIS — R1010 Upper abdominal pain, unspecified: Secondary | ICD-10-CM

## 2018-08-08 DIAGNOSIS — E119 Type 2 diabetes mellitus without complications: Secondary | ICD-10-CM | POA: Diagnosis not present

## 2018-08-08 DIAGNOSIS — E785 Hyperlipidemia, unspecified: Secondary | ICD-10-CM | POA: Diagnosis not present

## 2018-08-08 DIAGNOSIS — R112 Nausea with vomiting, unspecified: Secondary | ICD-10-CM

## 2018-08-08 LAB — COMPREHENSIVE METABOLIC PANEL
ALT: 16 U/L (ref 0–44)
AST: 23 U/L (ref 15–41)
Albumin: 4.9 g/dL (ref 3.5–5.0)
Alkaline Phosphatase: 76 U/L (ref 38–126)
Anion gap: 14 (ref 5–15)
BUN: 17 mg/dL (ref 6–20)
CO2: 26 mmol/L (ref 22–32)
Calcium: 10.1 mg/dL (ref 8.9–10.3)
Chloride: 97 mmol/L — ABNORMAL LOW (ref 98–111)
Creatinine, Ser: 0.7 mg/dL (ref 0.44–1.00)
GFR calc Af Amer: >60 mL/min (ref >60)
GFR calc non Af Amer: >60 mL/min (ref >60)
Glucose, Bld: 151 mg/dL — ABNORMAL HIGH (ref 70–99)
POTASSIUM: 3.7 mmol/L (ref 3.5–5.1)
Sodium: 137 mmol/L (ref 135–145)
TOTAL PROTEIN: 9.2 g/dL — AB (ref 6.5–8.1)
Total Bilirubin: 1.4 mg/dL — ABNORMAL HIGH (ref 0.3–1.2)

## 2018-08-08 LAB — CBC
HCT: 46.9 % — ABNORMAL HIGH (ref 36.0–46.0)
HEMOGLOBIN: 15.3 g/dL — AB (ref 12.0–15.0)
MCH: 30.1 pg (ref 26.0–34.0)
MCHC: 32.6 g/dL (ref 30.0–36.0)
MCV: 92.3 fL (ref 80.0–100.0)
Platelets: 277 10*3/uL (ref 150–400)
RBC: 5.08 MIL/uL (ref 3.87–5.11)
RDW: 12.7 % (ref 11.5–15.5)
WBC: 8.4 10*3/uL (ref 4.0–10.5)
nRBC: 0 % (ref 0.0–0.2)

## 2018-08-08 LAB — URINALYSIS, ROUTINE W REFLEX MICROSCOPIC
Bilirubin Urine: NEGATIVE
Glucose, UA: NEGATIVE mg/dL
Ketones, ur: 80 mg/dL — AB
Leukocytes, UA: NEGATIVE
NITRITE: POSITIVE — AB
Protein, ur: NEGATIVE mg/dL
SPECIFIC GRAVITY, URINE: 1.044 — AB (ref 1.005–1.030)
pH: 6 (ref 5.0–8.0)

## 2018-08-08 LAB — LIPASE, BLOOD: Lipase: 24 U/L (ref 11–51)

## 2018-08-08 MED ORDER — OMEPRAZOLE 40 MG PO CPDR: 40.0000 mg | DELAYED_RELEASE_CAPSULE | Freq: Every day | ORAL | 0 refills | Status: AC

## 2018-08-08 MED ORDER — ONDANSETRON 4 MG PO TBDP: ORAL_TABLET | ORAL | 0 refills | Status: DC

## 2018-08-08 MED ORDER — LIDOCAINE VISCOUS HCL 2 % MT SOLN
15.0000 mL | Freq: Once | OROMUCOSAL | Status: AC
  Administered 2018-08-08: 15 mL via ORAL
  Filled 2018-08-08: qty 15

## 2018-08-08 MED ORDER — CEPHALEXIN 500 MG PO CAPS: 500.0000 mg | ORAL_CAPSULE | Freq: Two times a day (BID) | ORAL | 0 refills | Status: AC

## 2018-08-08 MED ORDER — DICYCLOMINE HCL 10 MG/ML IM SOLN
20.0000 mg | Freq: Once | INTRAMUSCULAR | Status: AC
  Administered 2018-08-08: 20 mg via INTRAMUSCULAR
  Filled 2018-08-08: qty 2

## 2018-08-08 MED ORDER — HYDROMORPHONE HCL 1 MG/ML IJ SOLN
0.5000 mg | Freq: Once | INTRAMUSCULAR | Status: AC
  Administered 2018-08-08: 0.5 mg via INTRAVENOUS
  Filled 2018-08-08: qty 1

## 2018-08-08 MED ORDER — SODIUM CHLORIDE (PF) 0.9 % IJ SOLN
INTRAMUSCULAR | Status: AC
  Filled 2018-08-08: qty 50

## 2018-08-08 MED ORDER — IOPAMIDOL (ISOVUE-300) INJECTION 61%
INTRAVENOUS | Status: AC
  Administered 2018-08-08: 100 mL
  Filled 2018-08-08: qty 100

## 2018-08-08 MED ORDER — HYDROCODONE-ACETAMINOPHEN 5-325 MG PO TABS: 1.0000 | ORAL_TABLET | Freq: Four times a day (QID) | ORAL | 0 refills | Status: DC | PRN

## 2018-08-08 MED ORDER — SODIUM CHLORIDE 0.9 % IV BOLUS
1000.0000 mL | Freq: Once | INTRAVENOUS | Status: AC
  Administered 2018-08-08: 1000 mL via INTRAVENOUS

## 2018-08-08 MED ORDER — FAMOTIDINE IN NACL 20-0.9 MG/50ML-% IV SOLN
20.0000 mg | Freq: Once | INTRAVENOUS | Status: AC
  Administered 2018-08-08: 20 mg via INTRAVENOUS
  Filled 2018-08-08: qty 50

## 2018-08-08 MED ORDER — MORPHINE SULFATE (PF) 4 MG/ML IV SOLN
4.0000 mg | Freq: Once | INTRAVENOUS | Status: AC
  Administered 2018-08-08: 4 mg via INTRAVENOUS
  Filled 2018-08-08: qty 1

## 2018-08-08 MED ORDER — ONDANSETRON HCL 4 MG/2ML IJ SOLN
4.0000 mg | Freq: Once | INTRAMUSCULAR | Status: AC
  Administered 2018-08-08: 4 mg via INTRAVENOUS
  Filled 2018-08-08: qty 2

## 2018-08-08 MED ORDER — CEPHALEXIN 500 MG PO CAPS: 500.0000 mg | ORAL_CAPSULE | Freq: Once | ORAL | Status: DC

## 2018-08-08 MED ORDER — SUCRALFATE 1 GM/10ML PO SUSP: 1.0000 g | Freq: Three times a day (TID) | ORAL | 0 refills | Status: DC

## 2018-08-08 MED ORDER — ALUM & MAG HYDROXIDE-SIMETH 200-200-20 MG/5ML PO SUSP
30.0000 mL | Freq: Once | ORAL | Status: AC
  Administered 2018-08-08: 30 mL via ORAL
  Filled 2018-08-08: qty 30

## 2018-08-08 NOTE — Discharge Instructions (Signed)
Your pain and vomiting may be due to an ulcer or inflammation of the lining of your stomach, please take omeprazole once daily at least 30 minutes before breakfast, use Carafate 4 times daily with each meal and at bedtime, Zofran as needed for nausea, I did prescribe a small amount of pain medication for breakthrough pain.  Please avoid NSAIDs like ibuprofen or Aleve as these can worsen gastritis or ulcers.  Also avoid smoking or alcohol.  Use the information provided today to help make dietary modifications to improve your pain.  Urinalysis did show some signs of UTI, please take Keflex as directed.  You will need to follow-up with your primary care doctor as well as GI for further evaluation.  Return to the emergency department if you have significantly worsened abdominal pain, worsening vomiting, blood in your vomit or stool, fevers or any other new or concerning symptoms.

## 2018-08-08 NOTE — ED Notes (Signed)
RN made aware of pts elevated BP 

## 2018-08-08 NOTE — ED Triage Notes (Signed)
Patient c/o generalized abdominal pain with N/V/D since yesterday. Seen for same on 10/25, dx with UTI and reports completing abx.

## 2018-08-08 NOTE — ED Provider Notes (Signed)
[facility] DEPT Provider Note   CSN: [id number] Arrival date & time: 08/08/18  1436     History   Chief Complaint Chief Complaint  Patient presents with  . Abdominal Pain    HPI Sheila Padilla is a 57 y.o. female.  Sheila Padilla is a 57 y.o. Female with a history of diabetes, hyperlipidemia and depression, who presents to the emergency department for evaluation of lysed upper abdominal pain, nausea, vomiting and diarrhea, symptoms came on suddenly yesterday and have been severe and worsening since onset.  Reports several episodes of emesis, no blood in the emesis.  She denies any melena or hematochezia.  She has not had any fevers or chills.  Pain across her abdomen is a constant gnawing ache, she reports some sweats at night associated with this.  She denies any chest pain or shortness of breath, no cough.  She denies any lower abdominal pain.  No dysuria, urinary frequency or hematuria although does report that she was seen in October with a similar bout of symptoms and was diagnosed with a UTI at that time, completed her course of antibiotics and symptoms seem to improve although she reports she is still had some mild abdominal pain that never seems to fully went away.  She has not taken anything to treat her symptoms prior to arrival, denies any other aggravating or alleviating factors.     Past Medical History:  Diagnosis Date  . Depression 2014   and anxiety  . Diabetes mellitus without complication ([place]) 3428  . Hyperlipidemia 2014  . Insomnia   . Tobacco abuse age 29    Patient Active Problem List   Diagnosis Date Noted  . Insomnia   . Tobacco abuse   . Type II diabetes mellitus ([place]) 12/29/2013  . Diabetes mellitus without complication ([place]) 76/81/1572  . Hyperlipidemia 09/21/2012  . Depression 09/21/2012    Past Surgical History:  Procedure Laterality Date  . BREAST CYST EXCISION Right teen   twice in teenage years  .  CHOLECYSTECTOMY  1987   open  . REPLACEMENT TOTAL KNEE Left 2017     OB History   None      Home Medications    Prior to Admission medications   Medication Sig Start Date End Date Taking? Authorizing Provider  atorvastatin (LIPITOR) 40 MG tablet Take 1 tablet (40 mg total) by mouth daily. 10/13/17  Yes Mack Hook, MD  CHANTIX STARTING MONTH PAK 0.5 MG X 11 & 1 MG X 42 tablet Take 0.5 mg by mouth daily.  07/25/18  Yes [provider]  gabapentin (NEURONTIN) 300 MG capsule Take 600 mg by mouth at bedtime.  07/07/18  Yes [provider]  hydrOXYzine (ATARAX/VISTARIL) 25 MG tablet Take 25 mg by mouth every 8 (eight) hours as needed for anxiety.  07/05/18  Yes [provider]  metFORMIN (GLUCOPHAGE) 500 MG tablet TAKE 1 TABLET BY MOUTH TWICE DAILY WITH A MEAL Patient taking differently: Take 500 mg by mouth 2 (two) times daily with a meal.  07/03/18  Yes Mack Hook, MD  oxyCODONE-acetaminophen (PERCOCET/ROXICET) 5-325 MG tablet Take 1 tablet by mouth every 12 (twelve) hours as needed for moderate pain.  05/19/18  Yes [provider]  traZODone (DESYREL) 100 MG tablet Take 1 tablet (100 mg total) by mouth at bedtime. 10/13/17  Yes Mack Hook, MD  Truddie Crumble ULTRA-THIN LANCETS MISC Check blood glucose twice daily before meals 10/28/17   Mack Hook, MD  Blood Glucose Monitoring  Suppl (AGAMATRIX PRESTO) w/Device KIT Check blood glucose twice daily before meals 10/28/17   Mack Hook, MD  cephALEXin (KEFLEX) 500 MG capsule Take 1 capsule (500 mg total) by mouth 2 (two) times daily for 7 days. 08/08/18 08/15/18  Jacqlyn Larsen, PA-C  gabapentin (NEURONTIN) 100 MG capsule 1 cap by mouth at bedtime and increased by 1 cap every 3 days until taking 3 caps at bedtime. Patient not taking: Reported on 07/13/2018 10/13/17   Mack Hook, MD  glucose blood (AGAMATRIX PRESTO TEST) test strip Check blood glucose twice daily before  meals 10/28/17   Mack Hook, MD  HYDROcodone-acetaminophen (NORCO) 5-325 MG tablet Take 1 tablet by mouth every 6 (six) hours as needed. 08/08/18   Jacqlyn Larsen, PA-C  metoCLOPramide (REGLAN) 10 MG tablet Take 1 tablet (10 mg total) by mouth every 6 (six) hours as needed for nausea or vomiting (nausea/headache). Patient not taking: Reported on 08/08/2018 07/15/18   Orpah Greek, MD  omeprazole (PRILOSEC) 40 MG capsule Take 1 capsule (40 mg total) by mouth daily. 08/08/18 09/07/18  Jacqlyn Larsen, PA-C  ondansetron (ZOFRAN ODT) 4 MG disintegrating tablet 73m ODT q4 hours prn nausea/vomit 08/08/18   FJacqlyn Larsen PA-C  promethazine (PHENERGAN) 25 MG suppository Place 1 suppository (25 mg total) rectally every 6 (six) hours as needed for nausea or vomiting. 07/15/18   POrpah Greek MD  sucralfate (CARAFATE) 1 GM/10ML suspension Take 10 mLs (1 g total) by mouth 4 (four) times daily -  with meals and at bedtime. 08/08/18   FJacqlyn Larsen PA-C    Family History No family history on file.  Social History Social History   Tobacco Use  . Smoking status: Current Every Day Smoker    Packs/day: 0.50    Years: 38.00    Pack years: 19.00    Types: Cigarettes  . Smokeless tobacco: Never Used  Substance Use Topics  . Alcohol use: Not on file  . Drug use: Not on file     Allergies   Patient has no known allergies.   Review of Systems Review of Systems  Constitutional: Positive for chills. Negative for fever.  HENT: Negative for congestion, rhinorrhea and sore throat.   Eyes: Negative for visual disturbance.  Respiratory: Negative for cough and shortness of breath.   Cardiovascular: Negative for chest pain.  Gastrointestinal: Positive for abdominal pain, diarrhea, nausea and vomiting. Negative for blood in stool and constipation.  Genitourinary: Negative for dysuria.  Musculoskeletal: Negative for arthralgias and myalgias.  Skin: Negative for color change and  rash.  Neurological: Negative for dizziness, syncope and light-headedness.     Physical Exam Updated Vital Signs BP (!) 154/101 (BP Location: Right Arm)   Pulse (!) 104   Temp 98.4 F (36.9 C) (Oral)   Resp 14   Ht 5' 5.5" (1.664 m)   Wt 63.5 kg   LMP  (LMP Unknown)   SpO2 100%   BMI 22.94 kg/m   Physical Exam  Constitutional: She is oriented to person, place, and time. She appears well-developed and well-nourished.  Non-toxic appearance. She does not appear ill.  Patient appears uncomfortable with some dry heaving but is in no acute distress  HENT:  Head: Normocephalic and atraumatic.  Mouth/Throat: Oropharynx is clear and moist.  Eyes: Right eye exhibits no discharge. Left eye exhibits no discharge.  Cardiovascular: Normal rate, regular rhythm, normal heart sounds and intact distal pulses. Exam reveals no gallop and no friction rub.  No murmur heard. Pulmonary/Chest: Effort normal and breath sounds normal. No respiratory distress.  Respirations equal and unlabored, patient able to speak in full sentences, lungs clear to auscultation bilaterally  Abdominal: Soft. Normal appearance and bowel sounds are normal. She exhibits no distension. There is tenderness in the right upper quadrant, epigastric area and left upper quadrant.  Soft, nondistended, bowel sounds present throughout, there is diffuse tenderness through the upper abdomen with guarding primarily in the left upper quadrant, negative Murphy sign.  Minimal lower abdominal tenderness, without any guarding or rebound, no CVA tenderness bilaterally  Neurological: She is alert and oriented to person, place, and time. Coordination normal.  Skin: Skin is warm and dry. Capillary refill takes less than 2 seconds. She is not diaphoretic.  Psychiatric: She has a normal mood and affect. Her behavior is normal.  Nursing note and vitals reviewed.    ED Treatments / Results  Labs (all labs ordered are listed, but only abnormal  results are displayed) Labs Reviewed  COMPREHENSIVE METABOLIC PANEL - Abnormal; Notable for the following components:      Result Value   Chloride 97 (*)    Glucose, Bld 151 (*)    Total Protein 9.2 (*)    Total Bilirubin 1.4 (*)    All other components within normal limits  CBC - Abnormal; Notable for the following components:   Hemoglobin 15.3 (*)    HCT 46.9 (*)    All other components within normal limits  URINALYSIS, ROUTINE W REFLEX MICROSCOPIC - Abnormal; Notable for the following components:   Specific Gravity, Urine 1.044 (*)    Hgb urine dipstick SMALL (*)    Ketones, ur 80 (*)    Nitrite POSITIVE (*)    Bacteria, UA RARE (*)    All other components within normal limits  URINE CULTURE  LIPASE, BLOOD    EKG None  Radiology Ct Abdomen Pelvis W Contrast  Result Date: 08/08/2018 CLINICAL DATA:  Generalized abdominal pain since yesterday with nausea, vomiting and diarrhea. EXAM: CT ABDOMEN AND PELVIS WITH CONTRAST TECHNIQUE: Multidetector CT imaging of the abdomen and pelvis was performed using the standard protocol following bolus administration of intravenous contrast. CONTRAST:  163m ISOVUE-300 IOPAMIDOL (ISOVUE-300) INJECTION 61% COMPARISON:  07/13/2018 FINDINGS: Lower chest: Normal Hepatobiliary: Fatty change of the liver. No focal lesion. Previous cholecystectomy. Pancreas: Normal Spleen: Normal Adrenals/Urinary Tract: Adrenal glands are normal. Kidneys are normal. No cyst, mass, stone or hydronephrosis. Bladder is normal. Stomach/Bowel: No acute bowel finding. No evidence of ileus or obstruction. No evidence of inflammatory change. The appendix is normal. Vascular/Lymphatic: Normal Reproductive: Previous hysterectomy.  No pelvic mass. Other: No free fluid or air. Musculoskeletal: Ordinary lumbar degenerative changes. IMPRESSION: No definite cause of the presenting symptoms is identified. Mild diffuse fatty change of the liver. No acute intraabdominal finding otherwise.  Previous cholecystectomy and hysterectomy. Electronically Signed   By: MNelson ChimesM.D.   On: 08/08/2018 17:11    Procedures Procedures (including critical care time)  Medications Ordered in ED Medications  sodium chloride (PF) 0.9 % injection (has no administration in time range)  morphine 4 MG/ML injection 4 mg (4 mg Intravenous Given 08/08/18 1557)  sodium chloride 0.9 % bolus 1,000 mL ( Intravenous Stopped 08/08/18 1739)  ondansetron (ZOFRAN) injection 4 mg (4 mg Intravenous Given 08/08/18 1603)  famotidine (PEPCID) IVPB 20 mg premix ( Intravenous Stopped 08/08/18 1644)  dicyclomine (BENTYL) injection 20 mg (20 mg Intramuscular Given 08/08/18 1603)  iopamidol (ISOVUE-300) 61 % injection (100  mLs  Contrast Given 08/08/18 1651)  alum & mag hydroxide-simeth (MAALOX/MYLANTA) 200-200-20 MG/5ML suspension 30 mL (30 mLs Oral Given 08/08/18 1841)    And  lidocaine (XYLOCAINE) 2 % viscous mouth solution 15 mL (15 mLs Oral Given 08/08/18 1841)  ondansetron (ZOFRAN) injection 4 mg (4 mg Intravenous Given 08/08/18 1920)  HYDROmorphone (DILAUDID) injection 0.5 mg (0.5 mg Intravenous Given 08/08/18 1920)     Initial Impression / Assessment and Plan / ED Course  I have reviewed the triage vital signs and the nursing notes.  Pertinent labs & imaging results that were available during my care of the patient were reviewed by me and considered in my medical decision making (see chart for details).  Patient presents for evaluation of generalized upper abdominal pain with nausea, vomiting and diarrhea.  Pain started yesterday as well as nausea and vomiting.  Symptoms have been persistent.  Reports she had a similar episode of symptoms on 10/23 and was diagnosed with the UTI and completed her antibiotics and symptoms seem to improve but she still had some intermittent gnawing pains in her epigastric region.  On arrival she is mildly tachycardic and hypertensive but vitals are otherwise normal, she appears  uncomfortable but is in no acute distress.  On exam patient is tender throughout the upper abdomen with guarding, no focal area of tenderness, no Murphy sign.  Will get abdominal labs and CT abdomen pelvis and give IV fluids, pain medication, Zofran, Pepcid and Bentyl.  Presentation concerning for possible peptic ulcer disease given description of gnawing pain, and location.  Patient does not have acute surgical abdomen on exam.  Lab evaluation is overall reassuring, no leukocytosis, hemoglobin is slightly elevated at 15.3, I think this is likely in the setting of dehydration.  Glucose slightly elevated at 151, no other acute electrolyte derangements, normal renal and liver function, normal lipase.  Urinalysis with 80 of ketones which is in line with suspected dehydration, it is also positive for nitrates with some bacteria present, given the patient had similar presentation with UTI in the past think it is reasonable to treat for UTI, urine culture grew out light previously that was sensitive to Keflex, will treat with this again, urine culture sent.  CT abdomen pelvis shows no acute intra-abdominal or pelvic issue, there is some fatty infiltration of the liver.  Given reassuring scan I suspect patient is having some degree of gastritis or PUD, there is no evidence of perforation or free air.  Will give GI cocktail and additional dose of pain medication and nausea medicine.  Patient is tolerating p.o. fluids with no further vomiting.  Will treat for PUD/gastritis with PPI, Carafate, Zofran, and short course of pain medication for breakthrough pain, have also prescribed Keflex for UTI.  Will have patient follow-up with PCP and GI for further evaluation.  Return precautions have been discussed.  Patient expresses understanding and is in agreement with plan.  Stable for discharge home at this time.  Final Clinical Impressions(s) / ED Diagnoses   Final diagnoses:  Pain of upper abdomen  Non-intractable  vomiting with nausea, unspecified vomiting type  Acute cystitis without hematuria    ED Discharge Orders         Ordered    omeprazole (PRILOSEC) 40 MG capsule  Daily     08/08/18 1932    ondansetron (ZOFRAN ODT) 4 MG disintegrating tablet     08/08/18 1932    sucralfate (CARAFATE) 1 GM/10ML suspension  3 times daily with meals &  bedtime     08/08/18 1932    cephALEXin (KEFLEX) 500 MG capsule  2 times daily     08/08/18 1932    HYDROcodone-acetaminophen (NORCO) 5-325 MG tablet  Every 6 hours PRN     08/08/18 1932           Janet Berlin 08/08/18 2237    Jola Schmidt, MD 08/09/18 [phone]

## 2018-08-11 LAB — URINE CULTURE

## 2018-08-12 ENCOUNTER — Telehealth: Payer: Self-pay | Admitting: Emergency Medicine

## 2018-08-12 NOTE — Telephone Encounter (Signed)
Post ED Visit - Positive Culture Follow-up  Culture report reviewed by antimicrobial stewardship pharmacist:  []  Sheila Padilla, Pharm.D. []  Sheila Padilla, Pharm.D., BCPS AQ-ID []  Sheila Padilla, Pharm.D., BCPS []  Sheila Padilla, Pharm.D., BCPS []  Sheila Padilla, [address] BoulevardPharm.D., BCPS, AAHIVP []  Sheila Padilla, Pharm.D., BCPS, AAHIVP [x]  Sheila Padilla, PharmD, BCPS []  Sheila Padilla, PharmD, BCPS []  Sheila Padilla, PharmD, BCPS []  Sheila Padilla, PharmD  Positive urine culture Treated with cephalexin, organism sensitive to the same and no further patient follow-up is required at this time.  Sheila Padilla, Sheila Padilla 08/12/2018, 11:18 AM

## 2018-10-22 ENCOUNTER — Other Ambulatory Visit: Payer: Self-pay | Admitting: Internal Medicine

## 2018-11-23 ENCOUNTER — Other Ambulatory Visit: Payer: Self-pay | Admitting: Internal Medicine

## 2019-01-23 ENCOUNTER — Emergency Department (HOSPITAL_COMMUNITY): Payer: Medicare Other

## 2019-01-23 ENCOUNTER — Other Ambulatory Visit: Payer: Self-pay

## 2019-01-23 ENCOUNTER — Inpatient Hospital Stay (HOSPITAL_COMMUNITY)
Admission: EM | Admit: 2019-01-23 | Discharge: 2019-01-28 | DRG: 074 | Disposition: A | Discharge status: Home / Self Care | Payer: Medicare Other | Attending: Family Medicine | Admitting: Family Medicine

## 2019-01-23 ENCOUNTER — Encounter (HOSPITAL_COMMUNITY): Payer: Self-pay | Admitting: Emergency Medicine

## 2019-01-23 DIAGNOSIS — R112 Nausea with vomiting, unspecified: Secondary | ICD-10-CM | POA: Diagnosis not present

## 2019-01-23 DIAGNOSIS — E782 Mixed hyperlipidemia: Secondary | ICD-10-CM | POA: Diagnosis not present

## 2019-01-23 DIAGNOSIS — G894 Chronic pain syndrome: Secondary | ICD-10-CM | POA: Diagnosis present

## 2019-01-23 DIAGNOSIS — Z79899 Other long term (current) drug therapy: Secondary | ICD-10-CM

## 2019-01-23 DIAGNOSIS — F418 Other specified anxiety disorders: Secondary | ICD-10-CM | POA: Diagnosis present

## 2019-01-23 DIAGNOSIS — Z7984 Long term (current) use of oral hypoglycemic drugs: Secondary | ICD-10-CM | POA: Diagnosis not present

## 2019-01-23 DIAGNOSIS — Z716 Tobacco abuse counseling: Secondary | ICD-10-CM | POA: Diagnosis not present

## 2019-01-23 DIAGNOSIS — F419 Anxiety disorder, unspecified: Secondary | ICD-10-CM | POA: Diagnosis present

## 2019-01-23 DIAGNOSIS — E860 Dehydration: Secondary | ICD-10-CM | POA: Diagnosis present

## 2019-01-23 DIAGNOSIS — R319 Hematuria, unspecified: Principal | ICD-10-CM

## 2019-01-23 DIAGNOSIS — G8929 Other chronic pain: Secondary | ICD-10-CM | POA: Diagnosis present

## 2019-01-23 DIAGNOSIS — Z96652 Presence of left artificial knee joint: Secondary | ICD-10-CM | POA: Diagnosis present

## 2019-01-23 DIAGNOSIS — I10 Essential (primary) hypertension: Secondary | ICD-10-CM | POA: Diagnosis present

## 2019-01-23 DIAGNOSIS — Z9049 Acquired absence of other specified parts of digestive tract: Secondary | ICD-10-CM | POA: Diagnosis not present

## 2019-01-23 DIAGNOSIS — K3184 Gastroparesis: Secondary | ICD-10-CM | POA: Diagnosis not present

## 2019-01-23 DIAGNOSIS — E876 Hypokalemia: Secondary | ICD-10-CM | POA: Diagnosis present

## 2019-01-23 DIAGNOSIS — N390 Urinary tract infection, site not specified: Secondary | ICD-10-CM | POA: Diagnosis present

## 2019-01-23 DIAGNOSIS — G4700 Insomnia, unspecified: Secondary | ICD-10-CM | POA: Diagnosis not present

## 2019-01-23 DIAGNOSIS — E1143 Type 2 diabetes mellitus with diabetic autonomic (poly)neuropathy: Principal | ICD-10-CM | POA: Diagnosis present

## 2019-01-23 DIAGNOSIS — E785 Hyperlipidemia, unspecified: Secondary | ICD-10-CM | POA: Diagnosis present

## 2019-01-23 DIAGNOSIS — N3000 Acute cystitis without hematuria: Secondary | ICD-10-CM | POA: Diagnosis not present

## 2019-01-23 DIAGNOSIS — Z79891 Long term (current) use of opiate analgesic: Secondary | ICD-10-CM

## 2019-01-23 DIAGNOSIS — Z20828 Contact with and (suspected) exposure to other viral communicable diseases: Secondary | ICD-10-CM | POA: Diagnosis present

## 2019-01-23 DIAGNOSIS — E119 Type 2 diabetes mellitus without complications: Secondary | ICD-10-CM | POA: Diagnosis not present

## 2019-01-23 DIAGNOSIS — F329 Major depressive disorder, single episode, unspecified: Secondary | ICD-10-CM | POA: Diagnosis not present

## 2019-01-23 DIAGNOSIS — F32A Depression, unspecified: Secondary | ICD-10-CM | POA: Diagnosis present

## 2019-01-23 DIAGNOSIS — F17210 Nicotine dependence, cigarettes, uncomplicated: Secondary | ICD-10-CM | POA: Diagnosis present

## 2019-01-23 DIAGNOSIS — Z720 Tobacco use: Secondary | ICD-10-CM | POA: Diagnosis present

## 2019-01-23 LAB — URINALYSIS, ROUTINE W REFLEX MICROSCOPIC
Bilirubin Urine: NEGATIVE
Glucose, UA: NEGATIVE mg/dL
Ketones, ur: 5 mg/dL — AB
Nitrite: NEGATIVE
Protein, ur: 30 mg/dL — AB
Specific Gravity, Urine: 1.023 (ref 1.005–1.030)
pH: 5 (ref 5.0–8.0)

## 2019-01-23 LAB — CBC
HCT: 42.3 % (ref 36.0–46.0)
Hemoglobin: 14.1 g/dL (ref 12.0–15.0)
MCH: 30.3 pg (ref 26.0–34.0)
MCHC: 33.3 g/dL (ref 30.0–36.0)
MCV: 91 fL (ref 80.0–100.0)
Platelets: 251 10*3/uL (ref 150–400)
RBC: 4.65 MIL/uL (ref 3.87–5.11)
RDW: 12.6 % (ref 11.5–15.5)
WBC: 8.4 10*3/uL (ref 4.0–10.5)
nRBC: 0 % (ref 0.0–0.2)

## 2019-01-23 LAB — I-STAT BETA HCG BLOOD, ED (MC, WL, AP ONLY): I-stat hCG, quantitative: 5.4 m[IU]/mL — ABNORMAL HIGH (ref <5)

## 2019-01-23 LAB — COMPREHENSIVE METABOLIC PANEL
ALT: 16 U/L (ref 0–44)
AST: 22 U/L (ref 15–41)
Albumin: 4.3 g/dL (ref 3.5–5.0)
Alkaline Phosphatase: 84 U/L (ref 38–126)
Anion gap: 14 (ref 5–15)
BUN: 10 mg/dL (ref 6–20)
CO2: 21 mmol/L — ABNORMAL LOW (ref 22–32)
Calcium: 9.6 mg/dL (ref 8.9–10.3)
Chloride: 101 mmol/L (ref 98–111)
Creatinine, Ser: 0.9 mg/dL (ref 0.44–1.00)
GFR calc Af Amer: >60 mL/min (ref >60)
GFR calc non Af Amer: >60 mL/min (ref >60)
Glucose, Bld: 172 mg/dL — ABNORMAL HIGH (ref 70–99)
Potassium: 3.7 mmol/L (ref 3.5–5.1)
Sodium: 136 mmol/L (ref 135–145)
Total Bilirubin: 1.5 mg/dL — ABNORMAL HIGH (ref 0.3–1.2)
Total Protein: 8.2 g/dL — ABNORMAL HIGH (ref 6.5–8.1)

## 2019-01-23 LAB — SARS CORONAVIRUS 2 BY RT PCR (HOSPITAL ORDER, PERFORMED IN ~~LOC~~ HOSPITAL LAB): SARS Coronavirus 2: NEGATIVE

## 2019-01-23 LAB — LIPASE, BLOOD: Lipase: 21 U/L (ref 11–51)

## 2019-01-23 MED ORDER — ENOXAPARIN SODIUM 40 MG/0.4ML ~~LOC~~ SOLN
40.0000 mg | SUBCUTANEOUS | Status: DC
  Administered 2019-01-23 – 2019-01-27 (×5): 40 mg via SUBCUTANEOUS
  Filled 2019-01-23 (×5): qty 0.4

## 2019-01-23 MED ORDER — BOOST / RESOURCE BREEZE PO LIQD CUSTOM
1.0000 | Freq: Three times a day (TID) | ORAL | Status: DC
  Administered 2019-01-25 – 2019-01-27 (×3): 1 via ORAL

## 2019-01-23 MED ORDER — SODIUM CHLORIDE 0.9 % IV SOLN
INTRAVENOUS | Status: DC
  Administered 2019-01-23: 22:00:00 via INTRAVENOUS

## 2019-01-23 MED ORDER — SODIUM CHLORIDE 0.9% FLUSH: 3.0000 mL | Freq: Once | INTRAVENOUS | Status: DC

## 2019-01-23 MED ORDER — SODIUM CHLORIDE 0.9 % IV BOLUS
1000.0000 mL | Freq: Once | INTRAVENOUS | Status: AC
  Administered 2019-01-23: 1000 mL via INTRAVENOUS

## 2019-01-23 MED ORDER — TRAMADOL HCL 50 MG PO TABS
50.0000 mg | ORAL_TABLET | Freq: Once | ORAL | Status: AC | PRN
  Administered 2019-01-24: 50 mg via ORAL
  Filled 2019-01-23: qty 1

## 2019-01-23 MED ORDER — TRAZODONE HCL 50 MG PO TABS
50.0000 mg | ORAL_TABLET | Freq: Once | ORAL | Status: AC
  Administered 2019-01-24: 50 mg via ORAL
  Filled 2019-01-23: qty 1

## 2019-01-23 MED ORDER — ONDANSETRON 4 MG PO TBDP
4.0000 mg | ORAL_TABLET | Freq: Once | ORAL | Status: AC | PRN
  Administered 2019-01-23: 4 mg via ORAL
  Filled 2019-01-23: qty 1

## 2019-01-23 MED ORDER — SODIUM CHLORIDE 0.9 % IV SOLN
1.0000 g | Freq: Once | INTRAVENOUS | Status: AC
  Administered 2019-01-23: 1 g via INTRAVENOUS
  Filled 2019-01-23: qty 10

## 2019-01-23 MED ORDER — OXYCODONE HCL 5 MG PO TABS
5.0000 mg | ORAL_TABLET | Freq: Four times a day (QID) | ORAL | Status: AC | PRN
  Administered 2019-01-23 – 2019-01-24 (×2): 5 mg via ORAL
  Filled 2019-01-23 (×2): qty 1

## 2019-01-23 MED ORDER — HALOPERIDOL LACTATE 5 MG/ML IJ SOLN
2.5000 mg | Freq: Once | INTRAMUSCULAR | Status: AC
  Administered 2019-01-23: 2.5 mg via INTRAVENOUS
  Filled 2019-01-23: qty 1

## 2019-01-23 MED ORDER — ONDANSETRON HCL 4 MG/2ML IJ SOLN
4.0000 mg | Freq: Four times a day (QID) | INTRAMUSCULAR | Status: DC | PRN
  Administered 2019-01-23 – 2019-01-27 (×6): 4 mg via INTRAVENOUS
  Filled 2019-01-23 (×6): qty 2

## 2019-01-23 MED ORDER — ONDANSETRON HCL 4 MG PO TABS: 4.0000 mg | ORAL_TABLET | Freq: Four times a day (QID) | ORAL | Status: DC | PRN

## 2019-01-23 MED ORDER — SODIUM CHLORIDE 0.9 % IV SOLN
1.0000 g | INTRAVENOUS | Status: DC
  Administered 2019-01-24 – 2019-01-25 (×2): 1 g via INTRAVENOUS
  Filled 2019-01-23 (×2): qty 10

## 2019-01-23 MED ORDER — HYDRALAZINE HCL 20 MG/ML IJ SOLN
10.0000 mg | Freq: Once | INTRAMUSCULAR | Status: AC
  Administered 2019-01-23: 10 mg via INTRAVENOUS
  Filled 2019-01-23: qty 1

## 2019-01-23 MED ORDER — METOCLOPRAMIDE HCL 5 MG/ML IJ SOLN
10.0000 mg | Freq: Once | INTRAMUSCULAR | Status: AC
  Administered 2019-01-23: 10 mg via INTRAVENOUS
  Filled 2019-01-23: qty 2

## 2019-01-23 NOTE — ED Triage Notes (Signed)
Pt with abdominal pain with emesis since yesterday. She reports being able to keep a little water down today. Denies chx pain or SOB. A/o x4

## 2019-01-23 NOTE — H&P (Signed)
History and Physical   Sheila Padilla ENI:[id number] DOB: 1960-11-24 DOA: 01/23/2019  Referring MD/NP/PA: Dr. Alvino Chapel  PCP: Magdalene Molly, Inda Merlin, NP   Outpatient Specialists: None  Patient coming from: Home  Chief Complaint: Abdominal pain nausea and vomiting  HPI: Sheila Padilla is a 58 y.o. female with medical history significant of diabetes, hypertension, tobacco abuse, insomnia and depression with anxiety who presents to the ER with intractable nausea vomiting abdominal pain.  Patient also complained of 10 out of 10 pain.  Denied any fever or chills.  Denied any diarrhea.  Patient has pain at 8 out of 10 in the central abdomen.  She has chronic pain and goes to the pain clinic.  She has had recurrent abdominal pain with nausea vomiting in the past which have been worked up.  No clear-cut cause has been found.  Patient now has recurrent symptoms.  No hematemesis.  She is not being able to keep anything down.  Patient is being admitted for management of nausea vomiting possible gastroparesis.  She is a diabetic but appears to be poorly controlled by her admission..  ED Course: Temperature is 99.8, blood pressure 118/82 pulse 140 respiratory rate of 18 oxygen sat 100% room air.  White count is 8.4 hemoglobin 14.1 and platelets 251.  CO2 21 otherwise the rest of the chemistry is within normal.  Glucose 172.  Chest x-ray so far negative.  Urinalysis showed many bacteria WBC 6-10 negative nitrite.  Patient suspected of having UTI as well as possible gastroparesis so she is being admitted for treatment.  Review of Systems: As per HPI otherwise 10 point review of systems negative.    Past Medical History:  Diagnosis Date  . Depression 2014   and anxiety  . Diabetes mellitus without complication ([place]) 6144  . Hyperlipidemia 2014  . Insomnia   . Tobacco abuse age 35    Past Surgical History:  Procedure Laterality Date  . BREAST CYST EXCISION Right teen   twice in teenage years  .  CHOLECYSTECTOMY  1987   open  . REPLACEMENT TOTAL KNEE Left 2017     reports that she has been smoking cigarettes. She has a 19.00 pack-year smoking history. She has never used smokeless tobacco. She reports current alcohol use. She reports previous drug use.  No Known Allergies  No family history on file.   Prior to Admission medications   Medication Sig Start Date End Date Taking? Authorizing Provider  Truddie Crumble ULTRA-THIN LANCETS MISC Check blood glucose twice daily before meals 10/28/17   Mack Hook, MD  atorvastatin (LIPITOR) 40 MG tablet Take 1 tablet (40 mg total) by mouth daily. 10/13/17   Mack Hook, MD  Blood Glucose Monitoring Suppl (AGAMATRIX PRESTO) w/Device KIT Check blood glucose twice daily before meals 10/28/17   Mack Hook, MD  CHANTIX STARTING MONTH PAK 0.5 MG X 11 & 1 MG X 42 tablet Take 0.5 mg by mouth daily.  07/25/18   [provider]  gabapentin (NEURONTIN) 100 MG capsule 1 cap by mouth at bedtime and increased by 1 cap every 3 days until taking 3 caps at bedtime. Patient not taking: Reported on 07/13/2018 10/13/17   Mack Hook, MD  gabapentin (NEURONTIN) 300 MG capsule Take 600 mg by mouth at bedtime.  07/07/18   [provider]  glucose blood (AGAMATRIX PRESTO TEST) test strip Check blood glucose twice daily before meals 10/28/17   Mack Hook, MD  HYDROcodone-acetaminophen (NORCO) 5-325 MG tablet Take 1 tablet by mouth  every 6 (six) hours as needed. 08/08/18   Jacqlyn Larsen, PA-C  hydrOXYzine (ATARAX/VISTARIL) 25 MG tablet Take 25 mg by mouth every 8 (eight) hours as needed for anxiety.  07/05/18   [provider]  metFORMIN (GLUCOPHAGE) 500 MG tablet TAKE 1 TABLET BY MOUTH TWICE DAILY WITH A MEAL Patient taking differently: Take 500 mg by mouth 2 (two) times daily with a meal.  07/03/18   Mack Hook, MD  metoCLOPramide (REGLAN) 10 MG tablet Take 1 tablet (10 mg total) by mouth every 6 (six)  hours as needed for nausea or vomiting (nausea/headache). Patient not taking: Reported on 08/08/2018 07/15/18   Orpah Greek, MD  omeprazole (PRILOSEC) 40 MG capsule Take 1 capsule (40 mg total) by mouth daily. 08/08/18 09/07/18  Jacqlyn Larsen, PA-C  ondansetron (ZOFRAN ODT) 4 MG disintegrating tablet 69m ODT q4 hours prn nausea/vomit 08/08/18   FJacqlyn Larsen PA-C  oxyCODONE-acetaminophen (PERCOCET/ROXICET) 5-325 MG tablet Take 1 tablet by mouth every 12 (twelve) hours as needed for moderate pain.  05/19/18   [provider]  promethazine (PHENERGAN) 25 MG suppository Place 1 suppository (25 mg total) rectally every 6 (six) hours as needed for nausea or vomiting. 07/15/18   POrpah Greek MD  sucralfate (CARAFATE) 1 GM/10ML suspension Take 10 mLs (1 g total) by mouth 4 (four) times daily -  with meals and at bedtime. 08/08/18   FJacqlyn Larsen PA-C  traZODone (DESYREL) 100 MG tablet Take 1 tablet (100 mg total) by mouth at bedtime. 10/13/17   MMack Hook MD    Physical Exam: Vitals:   01/23/19 1900 01/23/19 1930 01/23/19 2000 01/23/19 2122  BP: (!) 160/74 (!) 153/74 (!) 165/77 (!) 181/82  Pulse: 92 77 78 82  Resp:   18   Temp:    99.8 F (37.7 C)  TempSrc:    Oral  SpO2: 99% 96% 97% 97%  Weight:      Height:          Constitutional: NAD, coiled in fetal position Vitals:   01/23/19 1900 01/23/19 1930 01/23/19 2000 01/23/19 2122  BP: (!) 160/74 (!) 153/74 (!) 165/77 (!) 181/82  Pulse: 92 77 78 82  Resp:   18   Temp:    99.8 F (37.7 C)  TempSrc:    Oral  SpO2: 99% 96% 97% 97%  Weight:      Height:       Eyes: PERRL, lids and conjunctivae normal ENMT: Mucous membranes are dry posterior pharynx clear of any exudate or lesions.Normal dentition.  Neck: normal, supple, no masses, no thyromegaly Respiratory: clear to auscultation bilaterally, no wheezing, no crackles. Normal respiratory effort. No accessory muscle use.  Cardiovascular: Regular  rate and rhythm, no murmurs / rubs / gallops. No extremity edema. 2+ pedal pulses. No carotid bruits.  Abdomen: Mild periumbilical tenderness, no masses palpated. No hepatosplenomegaly. Bowel sounds positive.  Musculoskeletal: no clubbing / cyanosis. No joint deformity upper and lower extremities. Good ROM, no contractures. Normal muscle tone.  Skin: no rashes, lesions, ulcers. No induration Neurologic: CN 2-12 grossly intact. Sensation intact, DTR normal. Strength 5/5 in all 4.  Psychiatric: Normal judgment and insight. Alert and oriented x 3. Normal mood.     Labs on Admission: I have personally reviewed following labs and imaging studies  CBC: Recent Labs  Lab 01/23/19 1300  WBC 8.4  HGB 14.1  HCT 42.3  MCV 91.0  PLT 2408  Basic Metabolic Panel: Recent Labs  Lab 01/23/19 1300  NA 136  K 3.7  CL 101  CO2 21*  GLUCOSE 172*  BUN 10  CREATININE 0.90  CALCIUM 9.6   GFR: Estimated Creatinine Clearance: 63.6 mL/min (by C-G formula based on SCr of 0.9 mg/dL). Liver Function Tests: Recent Labs  Lab 01/23/19 1300  AST 22  ALT 16  ALKPHOS 84  BILITOT 1.5*  PROT 8.2*  ALBUMIN 4.3   Recent Labs  Lab 01/23/19 1300  LIPASE 21   No results for input(s): AMMONIA in the last 168 hours. Coagulation Profile: No results for input(s): INR, PROTIME in the last 168 hours. Cardiac Enzymes: No results for input(s): CKTOTAL, CKMB, CKMBINDEX, TROPONINI in the last 168 hours. BNP (last 3 results) No results for input(s): PROBNP in the last 8760 hours. HbA1C: No results for input(s): HGBA1C in the last 72 hours. CBG: No results for input(s): GLUCAP in the last 168 hours. Lipid Profile: No results for input(s): CHOL, HDL, LDLCALC, TRIG, CHOLHDL, LDLDIRECT in the last 72 hours. Thyroid Function Tests: No results for input(s): TSH, T4TOTAL, FREET4, T3FREE, THYROIDAB in the last 72 hours. Anemia Panel: No results for input(s): VITAMINB12, FOLATE, FERRITIN, TIBC, IRON, RETICCTPCT  in the last 72 hours. Urine analysis:    Component Value Date/Time   COLORURINE AMBER (A) 01/23/2019 1258   APPEARANCEUR HAZY (A) 01/23/2019 1258   LABSPEC 1.023 01/23/2019 1258   PHURINE 5.0 01/23/2019 1258   GLUCOSEU NEGATIVE 01/23/2019 1258   HGBUR MODERATE (A) 01/23/2019 1258   BILIRUBINUR NEGATIVE 01/23/2019 1258   KETONESUR 5 (A) 01/23/2019 1258   PROTEINUR 30 (A) 01/23/2019 1258   NITRITE NEGATIVE 01/23/2019 1258   LEUKOCYTESUR TRACE (A) 01/23/2019 1258   Sepsis Labs: @LABRCNTIP (procalcitonin:4,lacticidven:4) ) Recent Results (from the past 240 hour(s))  SARS Coronavirus 2 (CEPHEID - Performed in [place] hospital lab), Hosp Order     Status: None   Collection Time: 01/23/19  9:01 PM  Result Value Ref Range Status   SARS Coronavirus 2 NEGATIVE NEGATIVE Final    Comment: (NOTE) If result is NEGATIVE SARS-CoV-2 target nucleic acids are NOT DETECTED. The SARS-CoV-2 RNA is generally detectable in upper and lower  respiratory specimens during the acute phase of infection. The lowest  concentration of SARS-CoV-2 viral copies this assay can detect is 250  copies / mL. A negative result does not preclude SARS-CoV-2 infection  and should not be used as the sole basis for treatment or other  patient management decisions.  A negative result may occur with  improper specimen collection / handling, submission of specimen other  than nasopharyngeal swab, presence of viral mutation(s) within the  areas targeted by this assay, and inadequate number of viral copies  (<250 copies / mL). A negative result must be combined with clinical  observations, patient history, and epidemiological information. If result is POSITIVE SARS-CoV-2 target nucleic acids are DETECTED. The SARS-CoV-2 RNA is generally detectable in upper and lower  respiratory specimens dur ing the acute phase of infection.  Positive  results are indicative of active infection with SARS-CoV-2.  Clinical  correlation  with patient history and other diagnostic information is  necessary to determine patient infection status.  Positive results do  not rule out bacterial infection or co-infection with other viruses. If result is PRESUMPTIVE POSTIVE SARS-CoV-2 nucleic acids MAY BE PRESENT.   A presumptive positive result was obtained on the submitted specimen  and confirmed on repeat testing.  While 2019 novel coronavirus  (SARS-CoV-2) nucleic acids may be present  in the submitted sample  additional confirmatory testing may be necessary for epidemiological  and / or clinical management purposes  to differentiate between  SARS-CoV-2 and other Sarbecovirus currently known to infect humans.  If clinically indicated additional testing with an alternate test  methodology [id number]) is advised. The SARS-CoV-2 RNA is generally  detectable in upper and lower respiratory sp ecimens during the acute  phase of infection. The expected result is Negative. Fact Sheet for Patients:  StrictlyIdeas.no Fact Sheet for Healthcare Providers: BankingDealers.co.za This test is not yet approved or cleared by the [place] FDA and has been authorized for detection and/or diagnosis of SARS-CoV-2 by FDA under an Emergency Use Authorization (EUA).  This EUA will remain in effect (meaning this test can be used) for the duration of the COVID-19 declaration under Section 564(b)(1) of the Act, 21 U.S.C. section 360bbb-3(b)(1), unless the authorization is terminated or revoked sooner. Performed at [facility], [place] [address], [place], [place] [address]      Radiological Exams on Admission: No results found.   Assessment/Plan Principal Problem:   Nausea and/or vomiting Active Problems:   Type II diabetes mellitus (HCC)   Hyperlipidemia   Depression   Insomnia   Tobacco abuse   Chronic pain   Benign essential HTN   UTI (urinary tract infection)     #1 intractable  nausea with vomiting: Patient will be admitted for observation and symptom control.  She may require gastric emptying studies to rule out gastroparesis.  This especially with multiple admissions and ER visits in the past.  Chronic pain with chronic narcotic use could be responsible.  Check urine drug screen to see if she is withdrawing from narcotics.  #2 diabetes: Sliding scale insulin with home regimen.  Consider rechecking her A1c.  #3 tobacco abuse: Add nicotine patch to patient's regimen.  #4 UTI: She appears to have mild UTI.  Initiate Rocephin empirically while waiting for blood and urine culture results.  #5 chronic pain syndrome: Resume patient's home regimen when available.  #6 hypertension: Currently not keeping anything down.  Resume home regimen as soon as possible.  Blood pressure is okay at the moment but may use IV labetalol if it rises.  #7 hyperlipidemia: Continue with statin from home.   DVT prophylaxis: Lovenox Code Status: Full code Family Communication: No family at bedside Disposition Plan: Home Consults called: None Admission status: Observation  Severity of Illness: The appropriate patient status for this patient is OBSERVATION. Observation status is judged to be reasonable and necessary in order to provide the required intensity of service to ensure the patient's safety. The patient's presenting symptoms, physical exam findings, and initial radiographic and laboratory data in the context of their medical condition is felt to place them at decreased risk for further clinical deterioration. Furthermore, it is anticipated that the patient will be medically stable for discharge from the hospital within 2 midnights of admission. The following factors support the patient status of observation.   " The patient's presenting symptoms include nausea vomiting abdominal pain. " The physical exam findings include mild diffuse tenderness. " The initial radiographic and  laboratory data are possible UTI.     Barbette Merino MD [facility] Pager [phone]  If 7PM-7AM, please contact night-coverage [email or web address] Password [facility]  01/23/2019, 10:37 PM

## 2019-01-23 NOTE — ED Notes (Signed)
Report given to 5W RN. All questions answered 

## 2019-01-23 NOTE — ED Provider Notes (Addendum)
[facility] EMERGENCY DEPARTMENT Provider Note   CSN: [id number] Arrival date & time: 01/23/19  1243    History   Chief Complaint Chief Complaint  Patient presents with  . Abdominal Pain  . Emesis    HPI Sheila Padilla is a 58 y.o. female.     HPI Patient presents with abdominal pain nausea vomiting.  Began yesterday morning.  History of episodes of the same that reportedly not found a clear cause.  Has had CAT scans.  States she has a little bit of diarrhea but mostly vomiting.  No real headache.  States her medicines have been vomiting up to.  Has a history of chronic pain and is on pain medicines through the pain clinic.  Pain is typically in her right knee.  No fevers.  No sick contacts.  Pain is worse in the upper abdomen but involves the whole abdomen.  Somewhat crampy. Past Medical History:  Diagnosis Date  . Depression 2014   and anxiety  . Diabetes mellitus without complication ([place]) 4944  . Hyperlipidemia 2014  . Insomnia   . Tobacco abuse age 27    Patient Active Problem List   Diagnosis Date Noted  . Insomnia   . Tobacco abuse   . Type II diabetes mellitus ([place]) 12/29/2013  . Diabetes mellitus without complication ([place]) 96/75/9163  . Hyperlipidemia 09/21/2012  . Depression 09/21/2012    Past Surgical History:  Procedure Laterality Date  . BREAST CYST EXCISION Right teen   twice in teenage years  . CHOLECYSTECTOMY  1987   open  . REPLACEMENT TOTAL KNEE Left 2017     OB History   No obstetric history on file.      Home Medications    Prior to Admission medications   Medication Sig Start Date End Date Taking? Authorizing Provider  Truddie Crumble ULTRA-THIN LANCETS MISC Check blood glucose twice daily before meals 10/28/17   Mack Hook, MD  atorvastatin (LIPITOR) 40 MG tablet Take 1 tablet (40 mg total) by mouth daily. 10/13/17   Mack Hook, MD  Blood Glucose Monitoring Suppl (AGAMATRIX PRESTO) w/Device KIT Check blood  glucose twice daily before meals 10/28/17   Mack Hook, MD  CHANTIX STARTING MONTH PAK 0.5 MG X 11 & 1 MG X 42 tablet Take 0.5 mg by mouth daily.  07/25/18   [provider]  gabapentin (NEURONTIN) 100 MG capsule 1 cap by mouth at bedtime and increased by 1 cap every 3 days until taking 3 caps at bedtime. Patient not taking: Reported on 07/13/2018 10/13/17   Mack Hook, MD  gabapentin (NEURONTIN) 300 MG capsule Take 600 mg by mouth at bedtime.  07/07/18   [provider]  glucose blood (AGAMATRIX PRESTO TEST) test strip Check blood glucose twice daily before meals 10/28/17   Mack Hook, MD  HYDROcodone-acetaminophen (NORCO) 5-325 MG tablet Take 1 tablet by mouth every 6 (six) hours as needed. 08/08/18   Jacqlyn Larsen, PA-C  hydrOXYzine (ATARAX/VISTARIL) 25 MG tablet Take 25 mg by mouth every 8 (eight) hours as needed for anxiety.  07/05/18   [provider]  metFORMIN (GLUCOPHAGE) 500 MG tablet TAKE 1 TABLET BY MOUTH TWICE DAILY WITH A MEAL Patient taking differently: Take 500 mg by mouth 2 (two) times daily with a meal.  07/03/18   Mack Hook, MD  metoCLOPramide (REGLAN) 10 MG tablet Take 1 tablet (10 mg total) by mouth every 6 (six) hours as needed for nausea or vomiting (nausea/headache). Patient not taking:  Reported on 08/08/2018 07/15/18   Orpah Greek, MD  omeprazole (PRILOSEC) 40 MG capsule Take 1 capsule (40 mg total) by mouth daily. 08/08/18 09/07/18  Jacqlyn Larsen, PA-C  ondansetron (ZOFRAN ODT) 4 MG disintegrating tablet 67m ODT q4 hours prn nausea/vomit 08/08/18   FJacqlyn Larsen PA-C  oxyCODONE-acetaminophen (PERCOCET/ROXICET) 5-325 MG tablet Take 1 tablet by mouth every 12 (twelve) hours as needed for moderate pain.  05/19/18   [provider]  promethazine (PHENERGAN) 25 MG suppository Place 1 suppository (25 mg total) rectally every 6 (six) hours as needed for nausea or vomiting. 07/15/18   POrpah Greek MD  sucralfate (CARAFATE) 1 GM/10ML suspension Take 10 mLs (1 g total) by mouth 4 (four) times daily -  with meals and at bedtime. 08/08/18   FJacqlyn Larsen PA-C  traZODone (DESYREL) 100 MG tablet Take 1 tablet (100 mg total) by mouth at bedtime. 10/13/17   MMack Hook MD    Family History No family history on file.  Social History Social History   Tobacco Use  . Smoking status: Current Every Day Smoker    Packs/day: 0.50    Years: 38.00    Pack years: 19.00    Types: Cigarettes  . Smokeless tobacco: Never Used  Substance Use Topics  . Alcohol use: Yes  . Drug use: Not Currently     Allergies   Patient has no known allergies.   Review of Systems Review of Systems  Constitutional: Positive for appetite change.  HENT: Negative for congestion.   Respiratory: Negative for shortness of breath.   Gastrointestinal: Positive for abdominal pain, diarrhea, nausea and vomiting.  Genitourinary: Negative for flank pain.  Musculoskeletal: Negative for back pain.  Skin: Negative for rash.  Neurological: Negative for weakness.  Psychiatric/Behavioral: Negative for confusion.     Physical Exam Updated Vital Signs BP (!) 160/74   Pulse 92   Temp 98.2 F (36.8 C) (Oral)   Resp 17   Ht 5' 5"  (1.651 m)   Wt 65.8 kg   LMP  (LMP Unknown)   SpO2 99%   BMI 24.13 kg/m   Physical Exam Vitals signs and nursing note reviewed.  HENT:     Head: Normocephalic.     Mouth/Throat:     Mouth: Mucous membranes are dry.  Eyes:     General: No scleral icterus. Cardiovascular:     Rate and Rhythm: Regular rhythm.  Pulmonary:     Breath sounds: No wheezing, rhonchi or rales.  Abdominal:     Comments: Mild diffuse tenderness without rebound or guarding  Musculoskeletal:        General: No tenderness.  Skin:    General: Skin is warm.     Capillary Refill: Capillary refill takes less than 2 seconds.  Neurological:     Mental Status: She is alert. Mental status  is at baseline.      ED Treatments / Results  Labs (all labs ordered are listed, but only abnormal results are displayed) Labs Reviewed  COMPREHENSIVE METABOLIC PANEL - Abnormal; Notable for the following components:      Result Value   CO2 21 (*)    Glucose, Bld 172 (*)    Total Protein 8.2 (*)    Total Bilirubin 1.5 (*)    All other components within normal limits  URINALYSIS, ROUTINE W REFLEX MICROSCOPIC - Abnormal; Notable for the following components:   Color, Urine AMBER (*)    APPearance HAZY (*)  Hgb urine dipstick MODERATE (*)    Ketones, ur 5 (*)    Protein, ur 30 (*)    Leukocytes,Ua TRACE (*)    Bacteria, UA MANY (*)    All other components within normal limits  I-STAT BETA HCG BLOOD, ED ([facility], [facility], [facility] ONLY) - Abnormal; Notable for the following components:   I-stat hCG, quantitative 5.4 (*)    All other components within normal limits  URINE CULTURE  LIPASE, BLOOD  CBC    EKG None  Radiology No results found.  Procedures Procedures (including critical care time)  Medications Ordered in ED Medications  sodium chloride flush (NS) 0.9 % injection 3 mL (has no administration in time range)  ondansetron (ZOFRAN-ODT) disintegrating tablet 4 mg (4 mg Oral Given 01/23/19 1313)  metoCLOPramide (REGLAN) injection 10 mg (10 mg Intravenous Given 01/23/19 1731)  sodium chloride 0.9 % bolus 1,000 mL (0 mLs Intravenous Stopped 01/23/19 1906)  cefTRIAXone (ROCEPHIN) 1 g in sodium chloride 0.9 % 100 mL IVPB (0 g Intravenous Stopped 01/23/19 1934)  haloperidol lactate (HALDOL) injection 2.5 mg (2.5 mg Intravenous Given 01/23/19 1859)     Initial Impression / Assessment and Plan / ED Course  I have reviewed the triage vital signs and the nursing notes.  Pertinent labs & imaging results that were available during my care of the patient were reviewed by me and considered in my medical decision making (see chart for details).        Patient presents with nausea and vomiting  abdominal pain.  History of same.  However states has not been able to keep her medicines down.  Urine does show likely infection.  Patient states she has been having urinary symptoms.  Urine culture sent.  However patient still cannot really tolerate orals here.  Do not think she would manage to keep antibiotics down at home.  Will admit to unassigned medicine.  Has had antiemetics and still continues to vomit.  Final Clinical Impressions(s) / ED Diagnoses   Final diagnoses:  Urinary tract infection with hematuria, site unspecified  Intractable vomiting with nausea, unspecified vomiting type    ED Discharge Orders    None       Davonna Belling, MD 01/23/19 Dan Humphreys    Davonna Belling, MD 04/09/19 [id number]

## 2019-01-23 NOTE — ED Notes (Signed)
ED TO INPATIENT HANDOFF REPORT  ED Nurse Name and Phone #: [phone] Donetta Potts Name/Age/Gender Sheila Padilla 58 y.o. female Room/Bed: 021C/021C  Code Status   Code Status: Not on file  Home/SNF/Other Home Patient oriented to: self, place, time and situation Is this baseline? Yes   Triage Complete: Triage complete  Chief Complaint Abdominal Pain/Nausea  Triage Note Pt with abdominal pain with emesis since yesterday. She reports being able to keep a little water down today. Denies chx pain or SOB. A/o x4   Allergies No Known Allergies  Level of Care/Admitting Diagnosis ED Disposition    ED Disposition Condition Comment   Admit  Hospital Area: [facility] [[id number]]  Level of Care: Med-Surg [16]  Covid Evaluation: N/A  Diagnosis: Nausea and/or vomiting [201718]  Admitting Physician: Rometta Emery [[id number]]  Attending Physician: Rometta Emery [[id number]]  Estimated length of stay: past midnight tomorrow  Certification:: I certify this patient will need inpatient services for at least 2 midnights  PT Class (Do Not Modify): Inpatient [101]  PT Acc Code (Do Not Modify): Private [1]       B Medical/Surgery History Past Medical History:  Diagnosis Date  . Depression 2014   and anxiety  . Diabetes mellitus without complication (HCC) 2014  . Hyperlipidemia 2014  . Insomnia   . Tobacco abuse age 33   Past Surgical History:  Procedure Laterality Date  . BREAST CYST EXCISION Right teen   twice in teenage years  . CHOLECYSTECTOMY  1987   open  . REPLACEMENT TOTAL KNEE Left 2017     A IV Location/Drains/Wounds Patient Lines/Drains/Airways Status   Active Line/Drains/Airways    Name:   Placement date:   Placement time:   Site:   Days:   Peripheral IV 01/23/19 Right Antecubital   01/23/19    1720    Antecubital   less than 1          Intake/Output Last 24 hours  Intake/Output Summary (Last 24 hours) at 01/23/2019 2019 Last data filed at  01/23/2019 1906 Gross per 24 hour  Intake 1000 ml  Output -  Net 1000 ml    Labs/Imaging Results for orders placed or performed during the hospital encounter of 01/23/19 (from the past 48 hour(s))  Urinalysis, Routine w reflex microscopic     Status: Abnormal   Collection Time: 01/23/19 12:58 PM  Result Value Ref Range   Color, Urine AMBER (A) YELLOW    Comment: BIOCHEMICALS MAY BE AFFECTED BY COLOR   APPearance HAZY (A) CLEAR   Specific Gravity, Urine 1.023 1.005 - 1.030   pH 5.0 5.0 - 8.0   Glucose, UA NEGATIVE NEGATIVE mg/dL   Hgb urine dipstick MODERATE (A) NEGATIVE   Bilirubin Urine NEGATIVE NEGATIVE   Ketones, ur 5 (A) NEGATIVE mg/dL   Protein, ur 30 (A) NEGATIVE mg/dL   Nitrite NEGATIVE NEGATIVE   Leukocytes,Ua TRACE (A) NEGATIVE   RBC / HPF 0-5 0 - 5 RBC/hpf   WBC, UA 6-10 0 - 5 WBC/hpf   Bacteria, UA MANY (A) NONE SEEN   Squamous Epithelial / LPF 0-5 0 - 5   Mucus PRESENT     Comment: Performed at [facility], [address], [place], [place] [address]  Lipase, blood     Status: None   Collection Time: 01/23/19  1:00 PM  Result Value Ref Range   Lipase 21 11 - 51 U/L    Comment: Performed at [facility], [address] [place], [place] [address]  Comprehensive metabolic panel     Status: Abnormal   Collection Time: 01/23/19  1:00 PM  Result Value Ref Range   Sodium 136 135 - 145 mmol/L   Potassium 3.7 3.5 - 5.1 mmol/L   Chloride 101 98 - 111 mmol/L   CO2 21 (L) 22 - 32 mmol/L   Glucose, Bld 172 (H) 70 - 99 mg/dL   BUN 10 6 - 20 mg/dL   Creatinine, Ser 3.660.90 0.44 - 1.00 mg/dL   Calcium 9.6 8.9 - 44.010.3 mg/dL   Total Protein 8.2 (H) 6.5 - 8.1 g/dL   Albumin 4.3 3.5 - 5.0 g/dL   AST 22 15 - 41 U/L   ALT 16 0 - 44 U/L   Alkaline Phosphatase 84 38 - 126 U/L   Total Bilirubin 1.5 (H) 0.3 - 1.2 mg/dL   GFR calc non Af Amer >60 >60 mL/min   GFR calc Af Amer >60 >60 mL/min   Anion gap 14 5 - 15    Comment: Performed at [facility], [address] [place], [place] [address]  CBC     Status: None   Collection Time: 01/23/19  1:00 PM  Result Value Ref Range   WBC 8.4 4.0 - 10.5 K/uL   RBC 4.65 3.87 - 5.11 MIL/uL   Hemoglobin 14.1 12.0 - 15.0 g/dL   HCT 59.542.3 63.836.0 - 75.646.0 %   MCV 91.0 80.0 - 100.0 fL   MCH 30.3 26.0 - 34.0 pg   MCHC 33.3 30.0 - 36.0 g/dL   RDW 43.312.6 29.511.5 - 18.815.5 %   Platelets 251 150 - 400 K/uL   nRBC 0.0 0.0 - 0.2 %    Comment: Performed at [facility], [address] [place], [place] [address]  I-Stat beta hCG blood, ED     Status: Abnormal   Collection Time: 01/23/19  1:10 PM  Result Value Ref Range   I-stat hCG, quantitative 5.4 (H) <5 mIU/mL   Comment 3            Comment:   GEST. AGE      CONC.  (mIU/mL)   <=1 WEEK        5 - 50     2 WEEKS       50 - 500     3 WEEKS       100 - 10,000     4 WEEKS     1,000 - 30,000        FEMALE AND NON-PREGNANT FEMALE:     LESS THAN 5 mIU/mL    No results found.  Pending Labs Unresulted Labs (From admission, onward)    Start     Ordered   01/23/19 2006  SARS Coronavirus 2 (CEPHEID - Performed in [facility]), Hosp Order  (Asymptomatic Patients Labs)  Once,   R    Question:  Rule Out  Answer:  Yes   01/23/19 2005   01/23/19 1805  Urine culture  ONCE - STAT,   STAT     01/23/19 1804   Signed and Held  HIV antibody (Routine Testing)  Once,   R     Signed and Held   Signed and Held  CBC  (enoxaparin (LOVENOX)    CrCl >/= 30 ml/min)  Once,   R    Comments:  Baseline for enoxaparin therapy IF NOT ALREADY DRAWN.  Notify MD if  PLT < 100 K.    Signed and Held   Signed and Held  Creatinine, serum  (enoxaparin (LOVENOX)    CrCl >/= 30 ml/min)  Once,   R    Comments:  Baseline for enoxaparin therapy IF NOT ALREADY DRAWN.    Signed and Held   Signed and Held  Creatinine, serum  (enoxaparin (LOVENOX)    CrCl >/= 30 ml/min)  Weekly,   R    Comments:  while on enoxaparin therapy    Signed and Held   Signed and Held  Comprehensive metabolic panel   Tomorrow morning,   R     Signed and Held   Signed and Held  CBC  Tomorrow morning,   R     Signed and Held          Vitals/Pain Today's Vitals   01/23/19 1930 01/23/19 1930 01/23/19 1941 01/23/19 2000  BP: (!) 153/74   (!) 165/77  Pulse: 77   78  Resp:      Temp:      TempSrc:      SpO2: 96%   97%  Weight:      Height:      PainSc:  Asleep 8      Isolation Precautions No active isolations  Medications Medications  sodium chloride flush (NS) 0.9 % injection 3 mL (has no administration in time range)  ondansetron (ZOFRAN-ODT) disintegrating tablet 4 mg (4 mg Oral Given 01/23/19 1313)  metoCLOPramide (REGLAN) injection 10 mg (10 mg Intravenous Given 01/23/19 1731)  sodium chloride 0.9 % bolus 1,000 mL (0 mLs Intravenous Stopped 01/23/19 1906)  cefTRIAXone (ROCEPHIN) 1 g in sodium chloride 0.9 % 100 mL IVPB (0 g Intravenous Stopped 01/23/19 1934)  haloperidol lactate (HALDOL) injection 2.5 mg (2.5 mg Intravenous Given 01/23/19 1859)    Mobility walks with person assist Low fall risk   Focused Assessments Cardiac Assessment Handoff:    No results found for: CKTOTAL, CKMB, CKMBINDEX, TROPONINI No results found for: DDIMER Does the Patient currently have chest pain? Yes   , N/A   R Recommendations: See Admitting Provider Note  Report given to:   Additional Notes: N/A

## 2019-01-23 NOTE — Progress Notes (Signed)
Pt BP 169/85, Hydralazine given at 2231. Pt asymptomatic, Craige Cotta, NP notified.

## 2019-01-23 NOTE — Progress Notes (Signed)
Pt arrived to [address]. Ambulated from stretcher to bed, steady gate. Alert and oriented x 4. VS stable except for elevated BP. No signs of acute distress. Pt identified appropriately, advised about valuable policy.  Pt oriented to room and equipment, instructed on how to use call bell and verbalized understanding. Call bell left within reach. Pt c/o abdominal pain 9/10, temp 99.8 ( 98.2 earlier), BP 181/82. Craige Cotta, NP notified. Will continue to monitor and treat pt per MD orders.

## 2019-01-24 ENCOUNTER — Inpatient Hospital Stay (HOSPITAL_COMMUNITY): Payer: Medicare Other

## 2019-01-24 DIAGNOSIS — N390 Urinary tract infection, site not specified: Secondary | ICD-10-CM

## 2019-01-24 DIAGNOSIS — E876 Hypokalemia: Secondary | ICD-10-CM

## 2019-01-24 DIAGNOSIS — G894 Chronic pain syndrome: Secondary | ICD-10-CM

## 2019-01-24 LAB — COMPREHENSIVE METABOLIC PANEL
ALT: 12 U/L (ref 0–44)
AST: 18 U/L (ref 15–41)
Albumin: 4.1 g/dL (ref 3.5–5.0)
Alkaline Phosphatase: 76 U/L (ref 38–126)
Anion gap: 13 (ref 5–15)
BUN: 8 mg/dL (ref 6–20)
CO2: 22 mmol/L (ref 22–32)
Calcium: 9.1 mg/dL (ref 8.9–10.3)
Chloride: 100 mmol/L (ref 98–111)
Creatinine, Ser: 0.73 mg/dL (ref 0.44–1.00)
GFR calc Af Amer: >60 mL/min (ref >60)
GFR calc non Af Amer: >60 mL/min (ref >60)
Glucose, Bld: 130 mg/dL — ABNORMAL HIGH (ref 70–99)
Potassium: 3.3 mmol/L — ABNORMAL LOW (ref 3.5–5.1)
Sodium: 135 mmol/L (ref 135–145)
Total Bilirubin: 1.3 mg/dL — ABNORMAL HIGH (ref 0.3–1.2)
Total Protein: 8 g/dL (ref 6.5–8.1)

## 2019-01-24 LAB — CBC
HCT: 42.6 % (ref 36.0–46.0)
Hemoglobin: 14.2 g/dL (ref 12.0–15.0)
MCH: 29.9 pg (ref 26.0–34.0)
MCHC: 33.3 g/dL (ref 30.0–36.0)
MCV: 89.7 fL (ref 80.0–100.0)
Platelets: 212 10*3/uL (ref 150–400)
RBC: 4.75 MIL/uL (ref 3.87–5.11)
RDW: 12.2 % (ref 11.5–15.5)
WBC: 9.2 10*3/uL (ref 4.0–10.5)
nRBC: 0 % (ref 0.0–0.2)

## 2019-01-24 LAB — LACTIC ACID, PLASMA: Lactic Acid, Venous: 0.9 mmol/L (ref 0.5–1.9)

## 2019-01-24 LAB — URINE CULTURE: Culture: <10000 — AB

## 2019-01-24 LAB — GLUCOSE, CAPILLARY
Glucose-Capillary: 127 mg/dL — ABNORMAL HIGH (ref 70–99)
Glucose-Capillary: 127 mg/dL — ABNORMAL HIGH (ref 70–99)
Glucose-Capillary: 127 mg/dL — ABNORMAL HIGH (ref 70–99)
Glucose-Capillary: 141 mg/dL — ABNORMAL HIGH (ref 70–99)

## 2019-01-24 LAB — HIV ANTIBODY (ROUTINE TESTING W REFLEX): HIV Screen 4th Generation wRfx: NONREACTIVE

## 2019-01-24 MED ORDER — ATORVASTATIN CALCIUM 40 MG PO TABS
40.0000 mg | ORAL_TABLET | Freq: Every day | ORAL | Status: DC
  Administered 2019-01-24 – 2019-01-28 (×5): 40 mg via ORAL
  Filled 2019-01-24 (×5): qty 1

## 2019-01-24 MED ORDER — POTASSIUM CHLORIDE IN NACL 40-0.9 MEQ/L-% IV SOLN
INTRAVENOUS | Status: DC
  Administered 2019-01-24 – 2019-01-28 (×10): 125 mL/h via INTRAVENOUS
  Filled 2019-01-24 (×11): qty 1000

## 2019-01-24 MED ORDER — FENTANYL CITRATE (PF) 100 MCG/2ML IJ SOLN
50.0000 ug | Freq: Once | INTRAMUSCULAR | Status: AC
  Administered 2019-01-24: 50 ug via INTRAVENOUS
  Filled 2019-01-24: qty 2

## 2019-01-24 MED ORDER — HYDROXYZINE HCL 25 MG PO TABS: 25.0000 mg | ORAL_TABLET | Freq: Three times a day (TID) | ORAL | Status: DC | PRN

## 2019-01-24 MED ORDER — SODIUM CHLORIDE 0.9 % IV SOLN: INTRAVENOUS | Status: DC

## 2019-01-24 MED ORDER — TRAZODONE HCL 50 MG PO TABS
100.0000 mg | ORAL_TABLET | Freq: Every day | ORAL | Status: DC
  Administered 2019-01-24 – 2019-01-27 (×4): 100 mg via ORAL
  Filled 2019-01-24 (×4): qty 2

## 2019-01-24 MED ORDER — INSULIN ASPART 100 UNIT/ML ~~LOC~~ SOLN
0.0000 [IU] | Freq: Three times a day (TID) | SUBCUTANEOUS | Status: DC
  Administered 2019-01-25: 3 [IU] via SUBCUTANEOUS
  Administered 2019-01-26 – 2019-01-27 (×3): 2 [IU] via SUBCUTANEOUS
  Administered 2019-01-27: 3 [IU] via SUBCUTANEOUS

## 2019-01-24 MED ORDER — IOHEXOL 300 MG/ML  SOLN
100.0000 mL | Freq: Once | INTRAMUSCULAR | Status: AC | PRN
  Administered 2019-01-24: 100 mL via INTRAVENOUS

## 2019-01-24 MED ORDER — FENTANYL CITRATE (PF) 100 MCG/2ML IJ SOLN
25.0000 ug | INTRAMUSCULAR | Status: DC | PRN
  Administered 2019-01-24: 25 ug via INTRAVENOUS
  Filled 2019-01-24: qty 2

## 2019-01-24 MED ORDER — INSULIN ASPART 100 UNIT/ML ~~LOC~~ SOLN: 0.0000 [IU] | Freq: Every day | SUBCUTANEOUS | Status: DC

## 2019-01-24 MED ORDER — HYDRALAZINE HCL 20 MG/ML IJ SOLN
10.0000 mg | Freq: Four times a day (QID) | INTRAMUSCULAR | Status: DC | PRN
  Administered 2019-01-24 – 2019-01-26 (×3): 10 mg via INTRAVENOUS
  Filled 2019-01-24 (×4): qty 1

## 2019-01-24 MED ORDER — KETOROLAC TROMETHAMINE 15 MG/ML IJ SOLN
15.0000 mg | Freq: Four times a day (QID) | INTRAMUSCULAR | Status: DC | PRN
  Administered 2019-01-24 – 2019-01-26 (×5): 15 mg via INTRAVENOUS
  Filled 2019-01-24 (×5): qty 1

## 2019-01-24 MED ORDER — METOCLOPRAMIDE HCL 5 MG/ML IJ SOLN
5.0000 mg | Freq: Three times a day (TID) | INTRAMUSCULAR | Status: DC
  Administered 2019-01-24 – 2019-01-25 (×3): 5 mg via INTRAVENOUS
  Filled 2019-01-24 (×3): qty 2

## 2019-01-24 MED ORDER — FENTANYL CITRATE (PF) 100 MCG/2ML IJ SOLN
50.0000 ug | INTRAMUSCULAR | Status: DC | PRN
  Administered 2019-01-24 – 2019-01-25 (×5): 50 ug via INTRAVENOUS
  Filled 2019-01-24 (×5): qty 2

## 2019-01-24 NOTE — Progress Notes (Signed)
Pt asleep, requested sleep med earlier, will continue to monitor.

## 2019-01-24 NOTE — Progress Notes (Signed)
Initial Nutrition Assessment  INTERVENTION:   -Continue Boost Breeze po TID, each supplement provides 250 kcal and 9 grams of protein -Once diet advanced, would recommend Ensure MAX Protein daily, each supplement provides 150 kcal and 30 grams of protein  NUTRITION DIAGNOSIS:   Inadequate oral intake related to nausea, vomiting as evidenced by meal completion < 25%.  GOAL:   Patient will meet greater than or equal to 90% of their needs  MONITOR:   PO intake, Diet advancement, Labs, Weight trends, I & O's  REASON FOR ASSESSMENT:   Malnutrition Screening Tool    ASSESSMENT:   58 y.o. female with medical history significant of diabetes, hypertension, tobacco abuse, insomnia and depression with anxiety who presents to the ER with intractable nausea vomiting abdominal pain.  **RD working remotely**  Patient reports N/V PTA, which started 5/3. Pt is unable to keep food or medications down. Pt has had episodes of vomiting this morning, was administered nausea medication. Pt currently on clears but has not drank any Boost Breeze given nausea. Per chart review, pt with suspected gastroparesis and UTI. Would recommend Ensure Max once diet is tolerated and advanced past clears.   Per weight records, pt weighed 147 lb on 3/2, pt has lost 5 lb since then. This is a 3% wt loss x 2 months, insignificant for time frame).  Medications: IV Reglan every 8 hours, IV Zofran PRN Labs reviewed: CBGs: 127 Low K  NUTRITION - FOCUSED PHYSICAL EXAM:  Unable to perform per department requirements to work remotely.  Diet Order:   Diet Order            Diet clear liquid Room service appropriate? Yes; Fluid consistency: Thin  Diet effective now              EDUCATION NEEDS:   Not appropriate for education at this time  Skin:  Skin Assessment: Reviewed RN Assessment  Last BM:  5/3  Height:   Ht Readings from Last 1 Encounters:  01/24/19 5\' 5"  (1.651 m)    Weight:   Wt Readings  from Last 1 Encounters:  01/24/19 64.9 kg    Ideal Body Weight:  56.8 kg  BMI:  Body mass index is 23.81 kg/m.  Estimated Nutritional Needs:   Kcal:  1600-1800  Protein:  70-80g  Fluid:  1.8L/day  Tilda Franco, MS, RD, LDN [facility] Inpatient Clinical Dietitian Pager: [phone] After Hours Pager: [phone]

## 2019-01-24 NOTE — Progress Notes (Addendum)
PROGRESS NOTE                                                                                                                                                                                                             Patient Demographics:    Sheila Padilla, is a 58 y.o. female, DOB - 06-Dec-1960, ZOX:[id number]  Admit date - 01/23/2019   Admitting Physician Rometta Emery, MD  Outpatient Primary MD for the patient is Estell Harpin, Rutha Bouchard, NP  LOS - 1  Outpatient Specialists: None  Chief Complaint  Patient presents with   Abdominal Pain   Emesis       Brief Narrative 58 year old female with history of diabetes on metformin, hypertension, tobacco use, insomnia, depression and anxiety, chronic pain for which she follows at the pain clinic presented with intractable nausea and vomiting with abdominal pain for past 3 days without any fevers, chills or diarrhea.  Patient has had recurrent abdominal pain with nausea and vomiting the past without clear cause. In the ED she had low-grade fever of 99.8 F, normal WBC and electrolytes.  Chest x-ray was unremarkable.  UA suggestive of UTI.  Patient admitted for possible gastroparesis and UTI.   Subjective:   Patient complains of intractable pain in her upper and mid quadrant and had 2 episodes of vomiting this morning.  Reports pain not controlled with low-dose fentanyl.  Has not been able to keep anything down.  Assessment  & Plan :    Principal Problem: Intractable nausea and vomiting. No clear etiology.  Suspecting combination of UTI and?  Gastroparesis. Lipase normal.  Check lactic acid.  Metformin on hold.  Pain control with IV fentanyl (dose increased given persistent pain), placed on low-dose scheduled Reglan and PRN Zofran.  Add twice daily Pepcid   IV fluids with potassium. Try clear liquid. No signs of bowel distention or obstruction on exam.  Serial abdominal  exam.  If no improvement in the next 24 hours  obtain CT of the abdomen pelvis.    Active Problems: Dehydration and hypokalemia Continue IV fluids with potassium supplement.  UTI On empiric Rocephin.  Cultures pending.    Type II diabetes mellitus, controlled (HCC) On metformin which is held.  Monitor on sliding scale coverage.  Essential hypertension with elevated blood pressure. Likely aggravated by pain.  Add PRN  IV hydralazine.  Anxiety and depression  resume hydroxyzine and trazodone.  Hyperlipidemia Continue Lipitor  Chronic pain We will hold home dose Percocet and monitor on IV fentanyl.   Code Status : Full code  Family Communication  : None at bedside  Disposition Plan  : Home once improved possibly in the next 2 days if pain controlled and tolerating advance diet  Barriers For Discharge : Active symptoms  Consults  : None  Procedures  : None  DVT Prophylaxis  :  Lovenox -   Lab Results  Component Value Date   PLT 212 01/24/2019    Antibiotics  :    Anti-infectives (From admission, onward)   Start     Dose/Rate Route Frequency Ordered Stop   01/24/19 1800  cefTRIAXone (ROCEPHIN) 1 g in sodium chloride 0.9 % 100 mL IVPB     1 g 200 mL/hr over 30 Minutes Intravenous Every 24 hours 01/23/19 2124     01/23/19 1815  cefTRIAXone (ROCEPHIN) 1 g in sodium chloride 0.9 % 100 mL IVPB     1 g 200 mL/hr over 30 Minutes Intravenous  Once 01/23/19 1804 01/23/19 1934        Objective:   Vitals:   01/23/19 2122 01/23/19 2329 01/24/19 0128 01/24/19 0630  BP: (!) 181/82 (!) 169/85  (!) 183/81  Pulse: 82   83  Resp:      Temp: 99.8 F (37.7 C)  99.1 F (37.3 C) 98.7 F (37.1 C)  TempSrc: Oral  Oral Oral  SpO2: 97%     Weight:   64.9 kg   Height:   5\' 5"  (1.651 m)     Wt Readings from Last 3 Encounters:  01/24/19 64.9 kg  08/08/18 63.5 kg  10/13/17 70.8 kg     Intake/Output Summary (Last 24 hours) at 01/24/2019 1025 Last data filed at 01/24/2019  0338 Gross per 24 hour  Intake 1495.33 ml  Output 1300 ml  Net 195.33 ml     Physical Exam  Gen: not in distress HEENT: no pallor, moist mucosa, supple neck Chest: clear b/l, no added sounds CVS: N S1&S2, no murmurs, rubs or gallop GI: soft, NT, ND, BS+ Musculoskeletal: warm, no edema CNS: AAOX3, non focal    Data Review:    CBC Recent Labs  Lab 01/23/19 1300 01/24/19 0347  WBC 8.4 9.2  HGB 14.1 14.2  HCT 42.3 42.6  PLT 251 212  MCV 91.0 89.7  MCH 30.3 29.9  MCHC 33.3 33.3  RDW 12.6 12.2    Chemistries  Recent Labs  Lab 01/23/19 1300 01/24/19 0347  NA 136 135  K 3.7 3.3*  CL 101 100  CO2 21* 22  GLUCOSE 172* 130*  BUN 10 8  CREATININE 0.90 0.73  CALCIUM 9.6 9.1  AST 22 18  ALT 16 12  ALKPHOS 84 76  BILITOT 1.5* 1.3*   ------------------------------------------------------------------------------------------------------------------ No results for input(s): CHOL, HDL, LDLCALC, TRIG, CHOLHDL, LDLDIRECT in the last 72 hours.  Lab Results  Component Value Date   HGBA1C 7.1 (H) 10/26/2017   ------------------------------------------------------------------------------------------------------------------ No results for input(s): TSH, T4TOTAL, T3FREE, THYROIDAB in the last 72 hours.  Invalid input(s): FREET3 ------------------------------------------------------------------------------------------------------------------ No results for input(s): VITAMINB12, FOLATE, FERRITIN, TIBC, IRON, RETICCTPCT in the last 72 hours.  Coagulation profile No results for input(s): INR, PROTIME in the last 168 hours.  No results for input(s): DDIMER in the last 72 hours.  Cardiac Enzymes No results for input(s): CKMB, TROPONINI, MYOGLOBIN in the last  168 hours.  Invalid input(s): CK ------------------------------------------------------------------------------------------------------------------ No results found for: BNP  Inpatient Medications  Scheduled  Meds:  enoxaparin (LOVENOX) injection  40 mg Subcutaneous Q24H   feeding supplement  1 Container Oral TID BM   insulin aspart  0-15 Units Subcutaneous TID WC   insulin aspart  0-5 Units Subcutaneous QHS   sodium chloride flush  3 mL Intravenous Once   Continuous Infusions:  0.9 % NaCl with KCl 40 mEq / L 125 mL/hr (01/24/19 0757)   cefTRIAXone (ROCEPHIN)  IV     PRN Meds:.fentaNYL (SUBLIMAZE) injection, hydrALAZINE, ondansetron **OR** ondansetron (ZOFRAN) IV  Micro Results Recent Results (from the past 240 hour(s))  SARS Coronavirus 2 (CEPHEID - Performed in [facility] lab), Hosp Order     Status: None   Collection Time: 01/23/19  9:01 PM  Result Value Ref Range Status   SARS Coronavirus 2 NEGATIVE NEGATIVE Final    Comment: (NOTE) If result is NEGATIVE SARS-CoV-2 target nucleic acids are NOT DETECTED. The SARS-CoV-2 RNA is generally detectable in upper and lower  respiratory specimens during the acute phase of infection. The lowest  concentration of SARS-CoV-2 viral copies this assay can detect is 250  copies / mL. A negative result does not preclude SARS-CoV-2 infection  and should not be used as the sole basis for treatment or other  patient management decisions.  A negative result may occur with  improper specimen collection / handling, submission of specimen other  than nasopharyngeal swab, presence of viral mutation(s) within the  areas targeted by this assay, and inadequate number of viral copies  (<250 copies / mL). A negative result must be combined with clinical  observations, patient history, and epidemiological information. If result is POSITIVE SARS-CoV-2 target nucleic acids are DETECTED. The SARS-CoV-2 RNA is generally detectable in upper and lower  respiratory specimens dur ing the acute phase of infection.  Positive  results are indicative of active infection with SARS-CoV-2.  Clinical  correlation with patient history and other diagnostic  information is  necessary to determine patient infection status.  Positive results do  not rule out bacterial infection or co-infection with other viruses. If result is PRESUMPTIVE POSTIVE SARS-CoV-2 nucleic acids MAY BE PRESENT.   A presumptive positive result was obtained on the submitted specimen  and confirmed on repeat testing.  While 2019 novel coronavirus  (SARS-CoV-2) nucleic acids may be present in the submitted sample  additional confirmatory testing may be necessary for epidemiological  and / or clinical management purposes  to differentiate between  SARS-CoV-2 and other Sarbecovirus currently known to infect humans.  If clinically indicated additional testing with an alternate test  methodology [phone]([id number]) is advised. The SARS-CoV-2 RNA is generally  detectable in upper and lower respiratory sp ecimens during the acute  phase of infection. The expected result is Negative. Fact Sheet for Patients:  BoilerBrush.com.cyhttps://www.fda.gov/media/136312/download Fact Sheet for Healthcare Providers: https://pope.com/https://www.fda.gov/media/136313/download This test is not yet approved or cleared by the [place] FDA and has been authorized for detection and/or diagnosis of SARS-CoV-2 by FDA under an Emergency Use Authorization (EUA).  This EUA will remain in effect (meaning this test can be used) for the duration of the COVID-19 declaration under Section 564(b)(1) of the Act, 21 U.S.C. section 360bbb-3(b)(1), unless the authorization is terminated or revoked sooner. Performed at [facility], [address] [place], [place] [address]     Radiology Reports No results found.  Time Spent in minutes  35   Keirston Saephanh  Elanora Quin M.D on 01/24/2019 at 10:25 AM  Between 7am to 7pm - Pager - [phone]  After 7pm go to [email or web address] - password [facility]  [facility] -  Office  [phone]

## 2019-01-24 NOTE — Progress Notes (Signed)
Pt BP 183/81, c/o pain 9/10, oxy given at 4:30, MD notified.

## 2019-01-24 NOTE — Progress Notes (Signed)
Dr. Gonzella Lex made aware of patient still having severe abdominal pain despite the changes in pain medication. CT of abdomen has been ordered.

## 2019-01-25 LAB — BASIC METABOLIC PANEL
Anion gap: 16 — ABNORMAL HIGH (ref 5–15)
BUN: 8 mg/dL (ref 6–20)
CO2: 20 mmol/L — ABNORMAL LOW (ref 22–32)
Calcium: 9.3 mg/dL (ref 8.9–10.3)
Chloride: 98 mmol/L (ref 98–111)
Creatinine, Ser: 0.76 mg/dL (ref 0.44–1.00)
GFR calc Af Amer: >60 mL/min (ref >60)
GFR calc non Af Amer: >60 mL/min (ref >60)
Glucose, Bld: 120 mg/dL — ABNORMAL HIGH (ref 70–99)
Potassium: 4.1 mmol/L (ref 3.5–5.1)
Sodium: 134 mmol/L — ABNORMAL LOW (ref 135–145)

## 2019-01-25 LAB — GLUCOSE, CAPILLARY
Glucose-Capillary: 112 mg/dL — ABNORMAL HIGH (ref 70–99)
Glucose-Capillary: 165 mg/dL — ABNORMAL HIGH (ref 70–99)
Glucose-Capillary: 119 mg/dL — ABNORMAL HIGH (ref 70–99)
Glucose-Capillary: 153 mg/dL — ABNORMAL HIGH (ref 70–99)

## 2019-01-25 MED ORDER — SODIUM CHLORIDE 0.9 % IV SOLN
250.0000 mg | Freq: Three times a day (TID) | INTRAVENOUS | Status: DC
  Administered 2019-01-25 – 2019-01-28 (×9): 250 mg via INTRAVENOUS
  Filled 2019-01-25 (×18): qty 5

## 2019-01-25 MED ORDER — PROMETHAZINE HCL 25 MG RE SUPP
25.0000 mg | Freq: Four times a day (QID) | RECTAL | Status: DC | PRN
  Filled 2019-01-25: qty 1

## 2019-01-25 MED ORDER — PROMETHAZINE HCL 25 MG PO TABS
25.0000 mg | ORAL_TABLET | Freq: Four times a day (QID) | ORAL | Status: DC | PRN
  Administered 2019-01-26 – 2019-01-28 (×2): 25 mg via ORAL
  Filled 2019-01-25 (×2): qty 1

## 2019-01-25 MED ORDER — MORPHINE SULFATE (PF) 2 MG/ML IV SOLN
2.0000 mg | INTRAVENOUS | Status: DC | PRN
  Administered 2019-01-25 – 2019-01-26 (×4): 2 mg via INTRAVENOUS
  Filled 2019-01-25 (×5): qty 1

## 2019-01-25 MED ORDER — PROMETHAZINE HCL 25 MG/ML IJ SOLN
12.5000 mg | Freq: Four times a day (QID) | INTRAMUSCULAR | Status: DC | PRN
  Administered 2019-01-26: 12.5 mg via INTRAVENOUS
  Filled 2019-01-25: qty 1

## 2019-01-25 NOTE — Progress Notes (Signed)
PROGRESS NOTE    Zenda AlpersCarol Reta  ZOX:[id number] DOB: 04-27-1961 DOA: 01/23/2019 PCP: Estell HarpinGarner Brown, Rutha BouchardKathy Leigh, NP      Brief Narrative:  Mrs. Sheila Padilla is a 58 y.o. F with NIDDM, HTN, smoking and chronic pain on daily opiates for the last year who presents with epigastric pain and vomiting.  The patient has had a pattern of brief episodes of epigastric pain and vomiting, sometimes associated with alcohol, sometimes not, that have occurred over the last 6 months.  She [facility], had normal endoscopy.  Never had gastric emptying study.  Not on gastric motilty agent.      Assessment & Plan:  Possible gastroparesis Intractable vomiting episodes, recurrent -Start scheduled reglan -Phenergan PRN -IV fluids -Obtain gastric empytying study -Post-discharge follow up with GI -Would like to avoid opiates, but she is chronic daily user, worry about worsening pain, vomiting with withdrawal -Continue Toradol  Diabetes Glucoses control good -Continue atorvastatin -Continue SSI correction insulin  Hypertension BP elevated   Smoking  Cessation reocmmended  Chronic pain -Hold home Percocet -Judicious IV morphine ordered -Counseled to cease long-term      MDM and disposition: The below labs and imaging reports were reviewed and summarized above.  Medication management as above.  The patient was admitted with gastroparesis.    At this time, continued inpatient services are reasonable and expected, given the patient's: ongoing severe recurrent intractable vomiting, need for ongoing IV fluids, and frequent dosing of IV opiates.      DVT prophylaxis: Lovenox Code Status: FULL Family Communication: None    Consultants:   None  Procedures:   None  Antimicrobials:   None    Subjective: Still with frequent vomiting, retching.  Still severe "gut rot", epigastric pain.  No fever, hematochezia, hematemesis.  No dyspnea, cough, chest pain  Objective: Vitals:    01/24/19 1359 01/24/19 2146 01/25/19 0536 01/25/19 1321  BP: (!) 172/69 (!) 165/93 (!) 163/81 (!) 173/84  Pulse: 92 94 87 81  Resp: 18   18  Temp: 99.3 F (37.4 C) 99 F (37.2 C) 99.5 F (37.5 C) 98.7 F (37.1 C)  TempSrc: Oral Oral Oral Oral  SpO2: 100% 99% 99% 98%  Weight:      Height:        Intake/Output Summary (Last 24 hours) at 01/25/2019 1605 Last data filed at 01/25/2019 1046 Gross per 24 hour  Intake 500 ml  Output --  Net 500 ml   Filed Weights   01/23/19 1733 01/24/19 0128  Weight: 65.8 kg 64.9 kg    Examination: General appearance:  adult female, alert and in no acute distress.   HEENT: Anicteric, conjunctiva pink, lids and lashes normal. No nasal deformity, discharge, epistaxis.  Lips dry, dentition normal.  OP tacky dry, no oral lesions, hearing normal.   Skin: Warm and dry.  no jaundice.  No suspicious rashes or lesions. Cardiac: RRR, nl S1-S2, no murmurs appreciated.  Capillary refill is brisk.  JVP not visible.  No LE edema.  Radia  pulses 2+ and symmetric. Respiratory: Normal respiratory rate and rhythm.  CTAB without rales or wheezes. Abdomen: Abdomen soft.  Moderate nonfocal TTP without gaurding. No ascites, distension, hepatosplenomegaly.   MSK: No deformities or effusions. Neuro: Awake and alert.  EOMI, moves all extremities. Speech fluent.    Psych: Sensorium intact and responding to questions, attention normal. Affect anxious.  Judgment and insight appear normal.    Data Reviewed: I have personally reviewed following labs and imaging  studies:  CBC: Recent Labs  Lab 01/23/19 1300 01/24/19 0347  WBC 8.4 9.2  HGB 14.1 14.2  HCT 42.3 42.6  MCV 91.0 89.7  PLT 251 212   Basic Metabolic Panel: Recent Labs  Lab 01/23/19 1300 01/24/19 0347 01/25/19 0250  NA 136 135 134*  K 3.7 3.3* 4.1  CL 101 100 98  CO2 21* 22 20*  GLUCOSE 172* 130* 120*  BUN CREATININE 0.90 0.73 0.76  CALCIUM 9.6 9.1 9.3   GFR: Estimated Creatinine Clearance:  71.5 mL/min (by C-G formula based on SCr of 0.76 mg/dL). Liver Function Tests: Recent Labs  Lab 01/23/19 1300 01/24/19 0347  AST 22 18  ALT 16 12  ALKPHOS 84 76  BILITOT 1.5* 1.3*  PROT 8.2* 8.0  ALBUMIN 4.3 4.1   Recent Labs  Lab 01/23/19 1300  LIPASE 21   No results for input(s): AMMONIA in the last 168 hours. Coagulation Profile: No results for input(s): INR, PROTIME in the last 168 hours. Cardiac Enzymes: No results for input(s): CKTOTAL, CKMB, CKMBINDEX, TROPONINI in the last 168 hours. BNP (last 3 results) No results for input(s): PROBNP in the last 8760 hours. HbA1C: No results for input(s): HGBA1C in the last 72 hours. CBG: Recent Labs  Lab 01/24/19 1155 01/24/19 1637 01/24/19 2142 01/25/19 0753 01/25/19 1201  GLUCAP 127* 127* 141* 112* 165*   Lipid Profile: No results for input(s): CHOL, HDL, LDLCALC, TRIG, CHOLHDL, LDLDIRECT in the last 72 hours. Thyroid Function Tests: No results for input(s): TSH, T4TOTAL, FREET4, T3FREE, THYROIDAB in the last 72 hours. Anemia Panel: No results for input(s): VITAMINB12, FOLATE, FERRITIN, TIBC, IRON, RETICCTPCT in the last 72 hours. Urine analysis:    Component Value Date/Time   COLORURINE AMBER (A) 01/23/2019 1258   APPEARANCEUR HAZY (A) 01/23/2019 1258   LABSPEC 1.023 01/23/2019 1258   PHURINE 5.0 01/23/2019 1258   GLUCOSEU NEGATIVE 01/23/2019 1258   HGBUR MODERATE (A) 01/23/2019 1258   BILIRUBINUR NEGATIVE 01/23/2019 1258   KETONESUR 5 (A) 01/23/2019 1258   PROTEINUR 30 (A) 01/23/2019 1258   NITRITE NEGATIVE 01/23/2019 1258   LEUKOCYTESUR TRACE (A) 01/23/2019 1258   Sepsis Labs: (procalcitonin:4,lacticacidven:4)  ) Recent Results (from the past 240 hour(s))  SARS Coronavirus 2 (CEPHEID - Performed in [facility] hospital lab), Hosp Order     Status: None   Collection Time: 01/23/19  9:01 PM  Result Value Ref Range Status   SARS Coronavirus 2 NEGATIVE NEGATIVE Final    Comment: (NOTE) If  result is NEGATIVE SARS-CoV-2 target nucleic acids are NOT DETECTED. The SARS-CoV-2 RNA is generally detectable in upper and lower  respiratory specimens during the acute phase of infection. The lowest  concentration of SARS-CoV-2 viral copies this assay can detect is 250  copies / mL. A negative result does not preclude SARS-CoV-2 infection  and should not be used as the sole basis for treatment or other  patient management decisions.  A negative result may occur with  improper specimen collection / handling, submission of specimen other  than nasopharyngeal swab, presence of viral mutation(s) within the  areas targeted by this assay, and inadequate number of viral copies  (<250 copies / mL). A negative result must be combined with clinical  observations, patient history, and epidemiological information. If result is POSITIVE SARS-CoV-2 target nucleic acids are DETECTED. The SARS-CoV-2 RNA is generally detectable in upper and lower  respiratory specimens dur ing the acute phase of infection.  Positive  results are  indicative of active infection with SARS-CoV-2.  Clinical  correlation with patient history and other diagnostic information is  necessary to determine patient infection status.  Positive results do  not rule out bacterial infection or co-infection with other viruses. If result is PRESUMPTIVE POSTIVE SARS-CoV-2 nucleic acids MAY BE PRESENT.   A presumptive positive result was obtained on the submitted specimen  and confirmed on repeat testing.  While 2019 novel coronavirus  (SARS-CoV-2) nucleic acids may be present in the submitted sample  additional confirmatory testing may be necessary for epidemiological  and / or clinical management purposes  to differentiate between  SARS-CoV-2 and other Sarbecovirus currently known to infect humans.  If clinically indicated additional testing with an alternate test  methodology [phone]) is advised. The SARS-CoV-2 RNA is generally    detectable in upper and lower respiratory sp ecimens during the acute  phase of infection. The expected result is Negative. Fact Sheet for Patients:  BoilerBrush.com.cy Fact Sheet for Healthcare Providers: https://pope.com/ This test is not yet approved or cleared by the [place] FDA and has been authorized for detection and/or diagnosis of SARS-CoV-2 by FDA under an Emergency Use Authorization (EUA).  This EUA will remain in effect (meaning this test can be used) for the duration of the COVID-19 declaration under Section 564(b)(1) of the Act, 21 U.S.C. section 360bbb-3(b)(1), unless the authorization is terminated or revoked sooner. Performed at [facility], [address], [place], [place] [address]   Urine culture     Status: Abnormal   Collection Time: 01/23/19 10:38 PM  Result Value Ref Range Status   Specimen Description URINE, RANDOM  Final   Special Requests NONE  Final   Culture (A)  Final    <10,000 COLONIES/mL INSIGNIFICANT GROWTH Performed at [facility], [address] [place], [place] [address]    Report Status 01/24/2019 FINAL  Final         Radiology Studies: Ct Abdomen Pelvis W Contrast  Result Date: 01/24/2019 CLINICAL DATA:  Gastroenteritis, abdominal pain EXAM: CT ABDOMEN AND PELVIS WITH CONTRAST TECHNIQUE: Multidetector CT imaging of the abdomen and pelvis was performed using the standard protocol following bolus administration of intravenous contrast. CONTRAST:  OMNIPAQUE IOHEXOL 300 MG/ML  SOLN COMPARISON:  08/08/2018 FINDINGS: Lower chest: No acute abnormality. Hepatobiliary: No focal liver abnormality is seen. Status post cholecystectomy. No biliary dilatation. Pancreas: Unremarkable. No pancreatic ductal dilatation or surrounding inflammatory changes. Spleen: Normal in size without focal abnormality. Adrenals/Urinary Tract: Adrenal glands are unremarkable. Kidneys are normal,  without renal calculi, focal lesion, or hydronephrosis. Bladder is unremarkable. Stomach/Bowel: Stomach is within normal limits. Appendix appears normal. No evidence of bowel wall thickening, distention, or inflammatory changes. Vascular/Lymphatic: No significant vascular findings are present. No enlarged abdominal or pelvic lymph nodes. Reproductive: Status post hysterectomy. No adnexal masses. Other: No abdominal wall hernia or abnormality. No abdominopelvic ascites. Musculoskeletal: No acute or significant osseous findings. IMPRESSION: 1. No acute abdominal or pelvic pathology. Electronically Signed   By: Elige Ko   On: 01/24/2019 16:49        Scheduled Meds:  atorvastatin  40 mg Oral Daily   enoxaparin (LOVENOX) injection  40 mg Subcutaneous Q24H   feeding supplement  1 Container Oral TID BM   insulin aspart  0-15 Units Subcutaneous TID WC   insulin aspart  0-5 Units Subcutaneous QHS   sodium chloride flush  3 mL Intravenous Once   traZODone  100 mg Oral QHS   Continuous  Infusions:  0.9 % NaCl with KCl 40 mEq / L Stopped (01/25/19 0930)   cefTRIAXone (ROCEPHIN)  IV 1 g (01/24/19 1724)   erythromycin 250 mg (01/25/19 1549)     LOS: 2 days    Time spent: 35 minutes    Alberteen Sam, MD [facility] 01/25/2019, 4:05 PM     Please page through AMION:  [email or web address] Password [email or web address] If 7PM-7AM, please contact night-coverage

## 2019-01-26 DIAGNOSIS — F329 Major depressive disorder, single episode, unspecified: Secondary | ICD-10-CM

## 2019-01-26 DIAGNOSIS — G4700 Insomnia, unspecified: Secondary | ICD-10-CM

## 2019-01-26 LAB — CBC
HCT: 41.6 % (ref 36.0–46.0)
Hemoglobin: 14.1 g/dL (ref 12.0–15.0)
MCH: 29.9 pg (ref 26.0–34.0)
MCHC: 33.9 g/dL (ref 30.0–36.0)
MCV: 88.3 fL (ref 80.0–100.0)
Platelets: 251 10*3/uL (ref 150–400)
RBC: 4.71 MIL/uL (ref 3.87–5.11)
RDW: 11.7 % (ref 11.5–15.5)
WBC: 8.3 10*3/uL (ref 4.0–10.5)
nRBC: 0 % (ref 0.0–0.2)

## 2019-01-26 LAB — COMPREHENSIVE METABOLIC PANEL
ALT: 10 U/L (ref 0–44)
AST: 16 U/L (ref 15–41)
Albumin: 3.7 g/dL (ref 3.5–5.0)
Alkaline Phosphatase: 59 U/L (ref 38–126)
Anion gap: 10 (ref 5–15)
BUN: 6 mg/dL (ref 6–20)
CO2: 22 mmol/L (ref 22–32)
Calcium: 9.4 mg/dL (ref 8.9–10.3)
Chloride: 98 mmol/L (ref 98–111)
Creatinine, Ser: 0.73 mg/dL (ref 0.44–1.00)
GFR calc Af Amer: >60 mL/min (ref >60)
GFR calc non Af Amer: >60 mL/min (ref >60)
Glucose, Bld: 124 mg/dL — ABNORMAL HIGH (ref 70–99)
Potassium: 4.3 mmol/L (ref 3.5–5.1)
Sodium: 130 mmol/L — ABNORMAL LOW (ref 135–145)
Total Bilirubin: 1.2 mg/dL (ref 0.3–1.2)
Total Protein: 7.5 g/dL (ref 6.5–8.1)

## 2019-01-26 LAB — GLUCOSE, CAPILLARY
Glucose-Capillary: 120 mg/dL — ABNORMAL HIGH (ref 70–99)
Glucose-Capillary: 143 mg/dL — ABNORMAL HIGH (ref 70–99)
Glucose-Capillary: 130 mg/dL — ABNORMAL HIGH (ref 70–99)
Glucose-Capillary: 118 mg/dL — ABNORMAL HIGH (ref 70–99)

## 2019-01-26 MED ORDER — MORPHINE SULFATE (PF) 2 MG/ML IV SOLN
2.0000 mg | INTRAVENOUS | Status: DC | PRN
  Administered 2019-01-26 – 2019-01-27 (×2): 2 mg via INTRAVENOUS
  Filled 2019-01-26 (×3): qty 1

## 2019-01-26 MED ORDER — OXYCODONE-ACETAMINOPHEN 7.5-325 MG PO TABS
1.0000 | ORAL_TABLET | Freq: Four times a day (QID) | ORAL | Status: DC | PRN
  Administered 2019-01-26 – 2019-01-28 (×8): 1 via ORAL
  Filled 2019-01-26 (×9): qty 1

## 2019-01-26 MED ORDER — PANTOPRAZOLE SODIUM 40 MG PO TBEC
40.0000 mg | DELAYED_RELEASE_TABLET | Freq: Two times a day (BID) | ORAL | Status: DC
  Administered 2019-01-26 – 2019-01-28 (×5): 40 mg via ORAL
  Filled 2019-01-26 (×5): qty 1

## 2019-01-26 NOTE — TOC Initial Note (Signed)
Transition of Care [facility]) - Initial/Assessment Note    Patient Details  Name: Sheila Padilla MRN: [id number] Date of Birth: 1961-08-05  Transition of Care [facility]) CM/SW Contact:    Reola Mosher Phone Number: [phone] 01/26/2019, 9:52 AM  Clinical Narrative:                 Patient lives at home; PCP: Estell Harpin, Rutha Bouchard, NP; has private insurance with [employer] with prescription drug coverage; CM will continue to follow for progression of care.  Expected Discharge Plan: Home/Self Care Barriers to Discharge: No Barriers Identified   Patient Goals and CMS Choice Patient states their goals for this hospitalization and ongoing recovery are:: to go home CMS Medicare.gov Compare Post Acute Care list provided to:: Patient    Expected Discharge Plan and Services Expected Discharge Plan: Home/Self Care   Discharge Planning Services: CM Consult Post Acute Care Choice: NA Living arrangements for the past 2 months: Single Family Home                 DME Arranged: N/A DME Agency: NA                  Prior Living Arrangements/Services Living arrangements for the past 2 months: Single Family Home            Need for Family Participation in Patient Care: No (Comment) Care giver support system in place?: Yes (comment)   Criminal Activity/Legal Involvement Pertinent to Current Situation/Hospitalization: Yes - Comment as needed  Activities of Daily Living Home Assistive Devices/Equipment: None ADL Screening (condition at time of admission) Patient's cognitive ability adequate to safely complete daily activities?: Yes Is the patient deaf or have difficulty hearing?: No Does the patient have difficulty seeing, even when wearing glasses/contacts?: No Does the patient have difficulty concentrating, remembering, or making decisions?: No Patient able to express need for assistance with ADLs?: Yes Does the patient have difficulty dressing or bathing?:  No Independently performs ADLs?: Yes (appropriate for developmental age) Does the patient have difficulty walking or climbing stairs?: No Weakness of Legs: None Weakness of Arms/Hands: None  Permission Sought/Granted                  Emotional Assessment         Alcohol / Substance Use: Not Applicable Psych Involvement: No (comment)  Admission diagnosis:  Urinary tract infection with hematuria, site unspecified [N39.0, R31.9] Intractable vomiting with nausea, unspecified vomiting type [R11.2] Patient Active Problem List   Diagnosis Date Noted  . Chronic pain 01/23/2019  . Benign essential HTN 01/23/2019  . Nausea and/or vomiting 01/23/2019  . UTI (urinary tract infection) 01/23/2019  . Insomnia   . Tobacco abuse   . Type II diabetes mellitus (HCC) 12/29/2013  . Diabetes mellitus without complication (HCC) 09/21/2012  . Hyperlipidemia 09/21/2012  . Depression 09/21/2012   PCP:  Estell Harpin, Rutha Bouchard, NP Pharmacy:   [facility] [address] [place] - [address] [place] [address] Phone: [phone] Fax: [phone]     Social Determinants of Health (SDOH) Interventions    Readmission Risk Interventions No flowsheet data found.

## 2019-01-26 NOTE — Progress Notes (Signed)
PROGRESS NOTE    Sheila AlpersCarol Padilla  FAO:[id number] DOB: 12-Sep-1961 DOA: 01/23/2019 PCP: Estell HarpinGarner Brown, Rutha BouchardKathy Leigh, NP      Brief Narrative:  Sheila Padilla is a 58 y.o. F with NIDDM, HTN, smoking and chronic pain on daily opiates for the last year who presents with epigastric pain and vomiting.  The patient has had a pattern of brief episodes of epigastric pain and vomiting, sometimes associated with alcohol, sometimes not, that have occurred over the last 6 months.  [facility], had normal endoscopy.  Never had gastric emptying study.  Not on gastric motilty agent.      Assessment & Plan:  Possible gastroparesis Intractable vomiting episodes, recurrent Still with frequent vomiting.  Able to keep pills down last night, but no solid food. -Stop Reglan -Start erythromycin -Continue phenergan PRN -Continue IV fluids -Post-discharge follow up with GI and gastric emptying study -Would like to avoid opiates, but she is chronic daily user, worry about worsening pain, vomiting with withdrawal -Start pantoprazole twice daily   Diabetes Glucoses normal -Continue atorvastatin -Continue SSI correction insulin  Hypertension BP normal   Smoking  Cessation reocmmended  Chronic pain -Cautiously restart home Percocet in place of morphine -Counseled to cease long-term  SARS-CoV-2 testing was obtained for screening purposes.  COVID ruled out.     MDM and disposition: The below labs and imaging reports reviewed and summarized above.  Medication management as above.  The patient was admitted with gastroparesis.    She is continued frequent vomiting, preventing her from taking solid food by mouth.  We will continue IV fluids, scheduled antiemetics and gut motility agents, and hopefully progress towards taking in solid food 24 to 48 hours.      DVT prophylaxis: Lovenox Code Status: FULL Family Communication: None    Consultants:   None  Procedures:    None  Antimicrobials:   None    Subjective: Still with frequent vomiting, epigastric pain is moderate, heartburn-like.  No fever, cough, sputum production.  No chest pain, exertional discomfort.  No dysuria, hematuria, flank pain.  Objective: Vitals:   01/25/19 1752 01/25/19 2214 01/26/19 0541 01/26/19 1415  BP: (!) 160/95 (!) 169/85 (!) 157/91 (!) 169/104  Pulse: 77 92 96 99  Resp:  16 15 18   Temp:  99 F (37.2 C) 98.9 F (37.2 C) 99.5 F (37.5 C)  TempSrc:  Oral Oral Oral  SpO2:  99% 100% 99%  Weight:      Height:        Intake/Output Summary (Last 24 hours) at 01/26/2019 1458 Last data filed at 01/26/2019 [id number] Gross per 24 hour  Intake 3075.51 ml  Output --  Net 3075.51 ml   Filed Weights   01/23/19 1733 01/24/19 0128  Weight: 65.8 kg 64.9 kg    Examination: General appearance: Female, lying in bed, appears tired, no acute distress, keeps eyes closed, lights out. HEENT: Anicteric, conjunctival pink, lids and lashes normal.  No nasal deformity, discharge, or epistaxis.  Lips dry, dentition normal, oropharynx tacky dry, no oral lesions, hearing normal. Skin: Skin warm and dry without jaundice, suspicious rashes, or lesions. Cardiac: Regular rate and rhythm, no murmurs appreciated, JVP not visible, no lower extremity edema. Respiratory: Normal respiratory rate and rhythm, lungs clear without rales or wheezes. Abdomen: Abdomen soft, moderate nonfocal tenderness to palpation with voluntary guarding, no rigidity or rebound, no ascites, distention, or hepatosplenomegaly. MSK: Normal muscle bulk and tone. Neuro: Awake and alert, extraocular movements intact, moves all  extremities with normal strength and symmetric coordination, speech fluent.    Psych: Sodium intact responding to questions, attention normal, affect normal, judgment and insight normal, oriented to person place, and time..    Data Reviewed: I have personally reviewed following labs and imaging  studies:  CBC: Recent Labs  Lab 01/23/19 1300 01/24/19 0347 01/26/19 0425  WBC 8.4 9.2 8.3  HGB 14.1 14.2 14.1  HCT 42.3 42.6 41.6  MCV 91.0 89.7 88.3  PLT 251 212 251   Basic Metabolic Panel: Recent Labs  Lab 01/23/19 1300 01/24/19 0347 01/25/19 0250 01/26/19 0425  NA 136 135 134* 130*  K 3.7 3.3* 4.1 4.3  CL 101 100 98 98  CO2 21* 22 20* 22  GLUCOSE 172* 130* 120* 124*  BUN CREATININE 0.90 0.73 0.76 0.73  CALCIUM 9.6 9.1 9.3 9.4   GFR: Estimated Creatinine Clearance: 71.5 mL/min (by C-G formula based on SCr of 0.73 mg/dL). Liver Function Tests: Recent Labs  Lab 01/23/19 1300 01/24/19 0347 01/26/19 0425  AST ALT ALKPHOS 84 76 59  BILITOT 1.5* 1.3* 1.2  PROT 8.2* 8.0 7.5  ALBUMIN 4.3 4.1 3.7   Recent Labs  Lab 01/23/19 1300  LIPASE 21   No results for input(s): AMMONIA in the last 168 hours. Coagulation Profile: No results for input(s): INR, PROTIME in the last 168 hours. Cardiac Enzymes: No results for input(s): CKTOTAL, CKMB, CKMBINDEX, TROPONINI in the last 168 hours. BNP (last 3 results) No results for input(s): PROBNP in the last 8760 hours. HbA1C: No results for input(s): HGBA1C in the last 72 hours. CBG: Recent Labs  Lab 01/25/19 1201 01/25/19 1630 01/25/19 2214 01/26/19 0811 01/26/19 1230  GLUCAP 165* 119* 153* 120* 143*   Lipid Profile: No results for input(s): CHOL, HDL, LDLCALC, TRIG, CHOLHDL, LDLDIRECT in the last 72 hours. Thyroid Function Tests: No results for input(s): TSH, T4TOTAL, FREET4, T3FREE, THYROIDAB in the last 72 hours. Anemia Panel: No results for input(s): VITAMINB12, FOLATE, FERRITIN, TIBC, IRON, RETICCTPCT in the last 72 hours. Urine analysis:    Component Value Date/Time   COLORURINE AMBER (A) 01/23/2019 1258   APPEARANCEUR HAZY (A) 01/23/2019 1258   LABSPEC 1.023 01/23/2019 1258   PHURINE 5.0 01/23/2019 1258   GLUCOSEU NEGATIVE 01/23/2019 1258   HGBUR MODERATE (A) 01/23/2019  1258   BILIRUBINUR NEGATIVE 01/23/2019 1258   KETONESUR 5 (A) 01/23/2019 1258   PROTEINUR 30 (A) 01/23/2019 1258   NITRITE NEGATIVE 01/23/2019 1258   LEUKOCYTESUR TRACE (A) 01/23/2019 1258   Sepsis Labs: (procalcitonin:4,lacticacidven:4)  ) Recent Results (from the past 240 hour(s))  SARS Coronavirus 2 (CEPHEID - Performed in [facility] hospital lab), Hosp Order     Status: None   Collection Time: 01/23/19  9:01 PM  Result Value Ref Range Status   SARS Coronavirus 2 NEGATIVE NEGATIVE Final    Comment: (NOTE) If result is NEGATIVE SARS-CoV-2 target nucleic acids are NOT DETECTED. The SARS-CoV-2 RNA is generally detectable in upper and lower  respiratory specimens during the acute phase of infection. The lowest  concentration of SARS-CoV-2 viral copies this assay can detect is 250  copies / mL. A negative result does not preclude SARS-CoV-2 infection  and should not be used as the sole basis for treatment or other  patient management decisions.  A negative result may occur with  improper specimen collection / handling, submission of specimen other  than nasopharyngeal swab, presence of viral  mutation(s) within the  areas targeted by this assay, and inadequate number of viral copies  (<250 copies / mL). A negative result must be combined with clinical  observations, patient history, and epidemiological information. If result is POSITIVE SARS-CoV-2 target nucleic acids are DETECTED. The SARS-CoV-2 RNA is generally detectable in upper and lower  respiratory specimens dur ing the acute phase of infection.  Positive  results are indicative of active infection with SARS-CoV-2.  Clinical  correlation with patient history and other diagnostic information is  necessary to determine patient infection status.  Positive results do  not rule out bacterial infection or co-infection with other viruses. If result is PRESUMPTIVE POSTIVE SARS-CoV-2 nucleic acids MAY BE PRESENT.   A  presumptive positive result was obtained on the submitted specimen  and confirmed on repeat testing.  While 2019 novel coronavirus  (SARS-CoV-2) nucleic acids may be present in the submitted sample  additional confirmatory testing may be necessary for epidemiological  and / or clinical management purposes  to differentiate between  SARS-CoV-2 and other Sarbecovirus currently known to infect humans.  If clinically indicated additional testing with an alternate test  methodology [phone]) is advised. The SARS-CoV-2 RNA is generally  detectable in upper and lower respiratory sp ecimens during the acute  phase of infection. The expected result is Negative. Fact Sheet for Patients:  BoilerBrush.com.cy Fact Sheet for Healthcare Providers: https://pope.com/ This test is not yet approved or cleared by the [place] FDA and has been authorized for detection and/or diagnosis of SARS-CoV-2 by FDA under an Emergency Use Authorization (EUA).  This EUA will remain in effect (meaning this test can be used) for the duration of the COVID-19 declaration under Section 564(b)(1) of the Act, 21 U.S.C. section 360bbb-3(b)(1), unless the authorization is terminated or revoked sooner. Performed at [facility], [address] [place], [place] [address]   Urine culture     Status: Abnormal   Collection Time: 01/23/19 10:38 PM  Result Value Ref Range Status   Specimen Description URINE, RANDOM  Final   Special Requests NONE  Final   Culture (A)  Final    <10,000 COLONIES/mL INSIGNIFICANT GROWTH Performed at [facility], [address], [place], [place] [address]    Report Status 01/24/2019 FINAL  Final         Radiology Studies: Ct Abdomen Pelvis W Contrast  Result Date: 01/24/2019 CLINICAL DATA:  Gastroenteritis, abdominal pain EXAM: CT ABDOMEN AND PELVIS WITH CONTRAST TECHNIQUE: Multidetector CT imaging of the abdomen and pelvis was  performed using the standard protocol following bolus administration of intravenous contrast. CONTRAST:  OMNIPAQUE IOHEXOL 300 MG/ML  SOLN COMPARISON:  08/08/2018 FINDINGS: Lower chest: No acute abnormality. Hepatobiliary: No focal liver abnormality is seen. Status post cholecystectomy. No biliary dilatation. Pancreas: Unremarkable. No pancreatic ductal dilatation or surrounding inflammatory changes. Spleen: Normal in size without focal abnormality. Adrenals/Urinary Tract: Adrenal glands are unremarkable. Kidneys are normal, without renal calculi, focal lesion, or hydronephrosis. Bladder is unremarkable. Stomach/Bowel: Stomach is within normal limits. Appendix appears normal. No evidence of bowel wall thickening, distention, or inflammatory changes. Vascular/Lymphatic: No significant vascular findings are present. No enlarged abdominal or pelvic lymph nodes. Reproductive: Status post hysterectomy. No adnexal masses. Other: No abdominal wall hernia or abnormality. No abdominopelvic ascites. Musculoskeletal: No acute or significant osseous findings. IMPRESSION: 1. No acute abdominal or pelvic pathology. Electronically Signed   By: Elige Ko   On: 01/24/2019 16:49  Scheduled Meds:  atorvastatin  40 mg Oral Daily   enoxaparin (LOVENOX) injection  40 mg Subcutaneous Q24H   feeding supplement  1 Container Oral TID BM   insulin aspart  0-15 Units Subcutaneous TID WC   insulin aspart  0-5 Units Subcutaneous QHS   pantoprazole  40 mg Oral BID   sodium chloride flush  3 mL Intravenous Once   traZODone  100 mg Oral QHS   Continuous Infusions:  0.9 % NaCl with KCl 40 mEq / L 125 mL/hr (01/26/19 0830)   erythromycin 250 mg (01/26/19 1312)     LOS: 3 days    Time spent: 25 minutes    Alberteen Sam, MD [facility] 01/26/2019, 2:58 PM     Please page through AMION:  [email or web address] If 7PM-7AM, please contact night-coverage

## 2019-01-27 LAB — GLUCOSE, CAPILLARY
Glucose-Capillary: 111 mg/dL — ABNORMAL HIGH (ref 70–99)
Glucose-Capillary: 128 mg/dL — ABNORMAL HIGH (ref 70–99)
Glucose-Capillary: 158 mg/dL — ABNORMAL HIGH (ref 70–99)

## 2019-01-27 LAB — CBC
HCT: 41.5 % (ref 36.0–46.0)
Hemoglobin: 14 g/dL (ref 12.0–15.0)
MCH: 30 pg (ref 26.0–34.0)
MCHC: 33.7 g/dL (ref 30.0–36.0)
MCV: 88.9 fL (ref 80.0–100.0)
Platelets: 227 10*3/uL (ref 150–400)
RBC: 4.67 MIL/uL (ref 3.87–5.11)
RDW: 11.8 % (ref 11.5–15.5)
WBC: 7.6 10*3/uL (ref 4.0–10.5)
nRBC: 0 % (ref 0.0–0.2)

## 2019-01-27 LAB — COMPREHENSIVE METABOLIC PANEL
ALT: 9 U/L (ref 0–44)
AST: 14 U/L — ABNORMAL LOW (ref 15–41)
Albumin: 3.8 g/dL (ref 3.5–5.0)
Alkaline Phosphatase: 59 U/L (ref 38–126)
Anion gap: 11 (ref 5–15)
BUN: 7 mg/dL (ref 6–20)
CO2: 20 mmol/L — ABNORMAL LOW (ref 22–32)
Calcium: 9.4 mg/dL (ref 8.9–10.3)
Chloride: 100 mmol/L (ref 98–111)
Creatinine, Ser: 0.77 mg/dL (ref 0.44–1.00)
GFR calc Af Amer: >60 mL/min (ref >60)
GFR calc non Af Amer: >60 mL/min (ref >60)
Glucose, Bld: 121 mg/dL — ABNORMAL HIGH (ref 70–99)
Potassium: 4.5 mmol/L (ref 3.5–5.1)
Sodium: 131 mmol/L — ABNORMAL LOW (ref 135–145)
Total Bilirubin: 1.2 mg/dL (ref 0.3–1.2)
Total Protein: 7 g/dL (ref 6.5–8.1)

## 2019-01-27 MED ORDER — ADULT MULTIVITAMIN W/MINERALS CH
1.0000 | ORAL_TABLET | Freq: Every day | ORAL | Status: DC
  Administered 2019-01-27 – 2019-01-28 (×2): 1 via ORAL
  Filled 2019-01-27 (×2): qty 1

## 2019-01-27 MED ORDER — ALUM & MAG HYDROXIDE-SIMETH 200-200-20 MG/5ML PO SUSP
15.0000 mL | ORAL | Status: DC | PRN
  Administered 2019-01-27: 15 mL via ORAL
  Filled 2019-01-27: qty 30

## 2019-01-27 MED ORDER — ENSURE ENLIVE PO LIQD
237.0000 mL | Freq: Two times a day (BID) | ORAL | Status: DC
  Administered 2019-01-27 – 2019-01-28 (×3): 237 mL via ORAL

## 2019-01-27 NOTE — Progress Notes (Signed)
Nutrition Follow-up  RD working remotely.  DOCUMENTATION CODES:   Not applicable  INTERVENTION:   -D/c Boost Breeze po TID, each supplement provides 250 kcal and 9 grams of protein -Ensure Enlive po BID, each supplement provides 350 kcal and 20 grams of protein -Provided "Gastroparesis Nutrition Therapy" handout from [facility] Nutrition Care Manual; text pasted to AVS/discharge summary for pt to refer to at discharge  NUTRITION DIAGNOSIS:   Inadequate oral intake related to nausea, vomiting as evidenced by meal completion < 25%.  Ongoing  GOAL:   Patient will meet greater than or equal to 90% of their needs  Progressing  MONITOR:   PO intake, Diet advancement, Labs, Weight trends, I & O's  REASON FOR ASSESSMENT:   Malnutrition Screening Tool    ASSESSMENT:   58 y.o. female with medical history significant of diabetes, hypertension, tobacco abuse, insomnia and depression with anxiety who presents to the ER with intractable nausea vomiting abdominal pain.  Reviewed I/O's: +2.6 L x 24 hours and +6.8 L since admission  Emesis: 100 ml x 24 hours  Pt contained with abdominal pain overnight. She was just advanced to a carb modified diet. Meal completion remains poor; noted 0-5%. She had been refusing Boost Breeze supplements, but accepted this morning's dose.   Pt with poor oral intake and would benefit from nutrient dense supplement. One Ensure Enlive supplement provides 350 kcals, 20 grams protein, and 44-45 grams of carbohydrate vs one Glucerna shake supplement, which provides 220 kcals, 10 grams of protein, and 26 grams of carbohydrate. Given pt's hx of DM, RD will continue to monitor PO intake, CBGS, and adjust supplement regimen as appropriate.   Lab Results  Component Value Date   HGBA1C 7.1 (H) 10/26/2017   PTA DM medications are .   Labs reviewed: Na: 131 (on IV supplementation), CBGS: 11-130 (inpatient orders for glycemic control are 0-15 units insulin aspart TID  with meals and 0-5 units insulin aspart q HS).   Diet Order:   Diet Order            Diet Carb Modified Fluid consistency: Thin; Room service appropriate? Yes  Diet effective now              EDUCATION NEEDS:   Not appropriate for education at this time  Skin:  Skin Assessment: Reviewed RN Assessment  Last BM:  01/22/19  Height:   Ht Readings from Last 1 Encounters:  01/24/19 5\' 5"  (1.651 m)    Weight:   Wt Readings from Last 1 Encounters:  01/24/19 64.9 kg    Ideal Body Weight:  56.8 kg  BMI:  Body mass index is 23.81 kg/m.  Estimated Nutritional Needs:   Kcal:  1600-1800  Protein:  70-80g  Fluid:  1.8L/day    Monterius Rolf A. Mayford Knife, RD, LDN, CDCES Registered Dietitian II Certified Diabetes Care and Education Specialist Pager: [phone] After hours Pager: [phone]

## 2019-01-27 NOTE — Discharge Instructions (Signed)
Cannabinoid Hyperemesis Syndrome °Cannabinoid hyperemesis syndrome (CHS) is a condition that causes repeated nausea, vomiting, and abdominal pain after long-term (chronic) use of marijuana (cannabis). People with CHS typically use marijuana 3-5 times a day for many years before they have symptoms, although it is possible to develop CHS with as little as 1 use per day. °Symptoms of CHS may be mild at first but can get worse and more frequent. In some cases, CHS may cause vomiting many times a day, which can lead to weight loss and dehydration. CHS may go away and come back many times (recur). People may not have symptoms or may otherwise be healthy in between CHS attacks. °What are the causes? °The exact cause of this condition is not known. Long-term use of marijuana may over-stimulate certain proteins in the brain that react with chemicals in marijuana (cannabinoid receptors). This over-stimulation may cause CHS. °What are the signs or symptoms? °Symptoms of this condition are often mild during the first few attacks, but they can get worse over time. Symptoms may include: °· Frequent nausea, especially early in the morning. °· Vomiting. °· Abdominal pain. °Taking several hot showers throughout the day can also be a sign of this condition. People with CHS may do this because it relieves symptoms. °How is this diagnosed? °This condition may be diagnosed based on: °· Your symptoms and medical history, including any drug use. °· A physical exam. °You may have tests done to rule out other problems. These tests may include: °· Blood tests. °· Urine tests. °· Imaging tests, such as an X-ray or CT scan. °How is this treated? °Treatment for this condition involves stopping marijuana use. Your health care provider may recommend: °· A drug rehabilitation program, if you have trouble stopping marijuana use. °· Medicines for nausea. °· Hot showers to help relieve symptoms. °Certain creams that contain a substance called  capsaicin may improve symptoms when applied to the abdomen. Ask your health care provider before starting any medicines or other treatments. °Severe nausea and vomiting may require you to stay at the hospital. You may need IV fluids to prevent or treat dehydration. You may also need certain medicines that must be given at the hospital. °Follow these instructions at home: °During an attack ° °· Stay in bed and rest in a dark, quiet room. °· Take anti-nausea medicine as told by your health care provider. °· Try taking hot showers to relieve your symptoms. °After an attack °· Drink small amounts of clear fluids slowly. Gradually add more. °· Once you are able to eat without vomiting, eat soft foods in small amounts every 3-4 hours. °General instructions ° °· Do not use any products that contain marijuana.If you need help quitting, ask your health care provider for resources and treatment options. °· Drink enough fluid to keep your urine pale yellow. Avoid drinking fluids that have a lot of sugar or caffeine, such as coffee and soda. °· Take and apply over-the-counter and prescription medicines only as told by your health care provider. Ask your health care provider before starting any new medicines or treatments. °· Keep all follow-up visits as told by your health care provider. This is important. °Contact a health care provider if: °· Your symptoms get worse. °· You cannot drink fluids without vomiting. °· You have pain and trouble swallowing after an attack. °Get help right away if: °· You cannot stop vomiting. °· You have blood in your vomit or your vomit looks like coffee grounds. °· You have   severe abdominal pain.  You have stools that are bloody or black, or stools that look like tar.  You have symptoms of dehydration, such as: ? Sunken eyes. ? Inability to make tears. ? Cracked lips. ? Dry mouth. ? Decreased urine production. ? Weakness. ? Sleepiness. ? Fainting. Summary  Cannabinoid hyperemesis  syndrome (CHS) is a condition that causes repeated nausea, vomiting, and abdominal pain after long-term use of marijuana.  People with CHS typically use marijuana 3-5 times a day for many years before they have symptoms, although it is possible to develop CHS with as little as 1 use per day.  Treatment for this condition involves stopping marijuana use. Hot showers and capsaicin creams may also help relieve symptoms. Ask your health care provider before starting any medicines or other treatments.  Your health care provider may prescribe medicines to help with nausea.  Get help right away if you have signs of dehydration, such as dry mouth, decreased urine production, or weakness. This information is not intended to replace advice given to you by your health care provider. Make sure you discuss any questions you have with your health care provider. Document Released: 12/16/2016 Document Revised: 12/16/2016 Document Reviewed: 12/16/2016 Elsevier Interactive Patient Education  2019 [employer].  Gastroparesis Nutrition Therapy  Gastroparesis means that your stomach empties very slowly. This happens when the nerves to your stomach are damaged or do not work properly. This can cause bloating, stomach discomfort or pain, feeling full after eating only a small amount of food, nausea, or vomiting.  If you have diabetes in addition to gastroparesis, it is important to control your blood glucose. This will help the stomach empty.    Tips  Following these tips may help your stomach empty faster:   Eat small, frequent meals (4 to 6 times per day).   Do not eat solid foods that are high in fat and do not add too much fat to foods. See the Foods Not Recommended table for foods that are high fat.   High-fat solid foods may delay the emptying of your stomach.   Liquids that contain fat, such as milkshakes, may be tolerated and can provide needed calories.  Do not eat foods high in fiber. Do not take  fiber supplements or fiber bulking agents for constipation.   Do not eat foods that increase acid reflux:   Acidic, spicy, fried and greasy foods   Caffeine   Mint  Do not drink alcohol or smoke   Do not drink carbonated beverages, as they increase bloating.   Chew foods well before swallowing. Solid foods in the stomach do not empty well. If you have difficulty tolerating solid foods, ground foods may be better.   If symptoms are severe, semi-solid foods or liquids may need to be your main food sources. Choose liquid nutritional supplements that have less than or equal to 2 grams fiber per serving.   Sit upright while eating and sit upright or walk after meals. Do not lie down for 3 to 4 hours after eating to avoid reflux or regurgitation.   If you wish to nap during the day, nap first and then eat.   Drinking fluids at meals can take up room in your stomach, and you might not get enough calories. At every meal, first eat a grain food and a protein food or dairy product if your body can tolerate it. Drink fluids with calories. It may be better to delay fluids until after the meal and  drink more between meals.   Foods Recommended Food Group Foods Recommended   Grains Choose grain foods with less than 2 grams of fiber per serving; these will be made with white flour Crackers: saltines or graham crackers Cold cereal: puffed rice Cream of rice or wheat Grits (fine ground) Gluten free low fiber foods Pretzels White bread, toasted White rice, cook until very soft  Protein Foods Lean meat and poultry: well-cooked, very tender, moist, and chopped fine Fish: tuna, salmon, or white fish Egg whites, scrambled Peanut butter (limit to 1 tablespoon at a time)  Dairy Milk*, drink 2% if tolerated to get more nutrients or lactose-free 2% milk Fortified non-dairy milks: almond, cashew, coconut, or rice (be aware that these options are not good sources of protein so you will need to eat an  additional protein food) Fortified pea milk or soymilk (may cause gas and bloating for some) Instant breakfast* (pre-made lactose-free is sold in bottles) Milkshakes* (try blending in  to  cup canned fruit) Ice cream* (low-fat may be tolerated better; use in milkshakes to increase calories) Frozen yogurt Yogurt* Puddings and custard* Sherbet Liquid nutritional supplements with less than or equal to 2 grams fiber per 1 cup serving *Use lactose-free varieties to reduce gas and bloating  Vegetables Canned and well-cooked vegetables without seeds, skins or hulls Carrots, cooked Mashed potatoes (white, red or yellow) Sweet potato  Fruit Canned, soft and well-cooked fruits without seeds, skins or membranes Applesauce Banana, mashed may be tolerated better Diced peaches/pears fruit cups in juice Melon, very soft, cut into small pieces Fruit nectar juices  Oils When possible choose oils rather than solid fats Canola or olive oil Margarine  Other Clear soup Gelatin Popsicles   Foods Not Recommended Food Group Foods Not Recommended   Grains Bran Grains foods with 2 or more grams of fiber per serving: barley, brown rice, kasha, quinoa Popcorn Whole grain and high-fiber cereals, including oats or granola Whole grain bread or pasta  Protein Foods Fried meats, poultry or fish Sausage, bacon or hot dogs Seafood Tough meat, meat with gristle: steak, roast beef or pork chops Beans, peas or lentils Nuts  Dairy Cheese slices Liquid nutritional supplements that have more than 2 grams fiber per serving Pea milk, soymilk (may increase gas and bloating)  Vegetables Raw or undercooked vegetables Alfalfa, asparagus, bean sprouts, broccoli, brussels sprouts, cabbage, cauliflower, corn, green peas or any other kind of peas, lima beans, mushrooms, okra, onions, parsnips, peppers, pickles, potato skins, or spinach  Fruit Fresh fruit except for the ones in the foods recommended table Acidic fruit  and juices: oranges/orange juice, grapefruit/grapefruit juice, tomatoes/tomato juice Avocado Berries Coconut Dried fruit Fruit skin Mandarin oranges Pineapple  Oils Fried foods of any type  Other Coffee Olives or pickles Pizza Salsa Sushi   Gastroparesis Sample 1-Day Menu  Breakfast 1 slice white toast (1 carbohydrate serving)  1 teaspoon margarine, soft, tub   cup egg substitute  1 cup peach nectar (2 carbohydrate servings)  Morning Snack Smoothie made with:  small banana (1 carbohydrate serving)  1/3 cup [place] strawberry yogurt ( carbohydrate serving)  1 cup 2% milk (1 carbohydrate serving)  Lunch 2 ounces canned chicken  1 teaspoon mayonnaise  9 saltine crackers (1 carbohydrate servings)   cup applesauce (1 carbohydrate serving)  Afternoon Snack 1 slice white toast (1 carbohydrate serving)  1 tablespoon smooth peanut butter  Evening Meal 2 ounces baked fish   cup mashed potatoes (1 carbohydrate serving)  1 teaspoon  olive oil  1 cup 2% milk (1 carbohydrate serving)  Evening Snack 1 packet instant breakfast (1 carbohydrate servings)  1 cup 2% milk (1 carbohydrate serving)   Gastroparesis Vegetarian (Lacto-Ovo) Sample 1-Day Menu  Breakfast  cup cooked farina (1 carbohydrate serving)   cup egg substitute  2 teaspoons olive oil   cup peach nectar (2 carbohydrate servings)   cup 2% milk ( carbohydrate serving)  Morning Snack 1 slice white toast (1 carbohydrate serving)  1 tablespoon smooth peanut butter  Lunch  cup vegetable soup (1 carbohydrate serving)  9 saltine crackers (1 carbohydrate serving)   cup applesauce (1 carbohydrate serving)   cup 2% milk ( carbohydrate serving)  Afternoon Snack 6 ounces plain yogurt (1 carbohydrate serving)   small banana (1 carbohydrate servings)  Evening Meal  cup baked tofu  2/3 cup white rice (2 carbohydrate servings)  2 teaspoons olive oil   cup 2% milk ( carbohydrate serving)  Evening Snack 1 packet instant  breakfast (1 carbohydrate servings)  1 cup 2% milk (1 carbohydrate serving)   Gastroparesis Vegan Sample 1-Day Menu  Breakfast  cup cooked farina (1 carbohydrate serving)  1/3 cup tofu scramble  2 teaspoons olive oil   cup peach nectar (2 carbohydrate servings)   cup almond milk fortified with calcium, vitamin B12, and vitamin D  Morning Snack 1 slice white toast (1 carbohydrate serving)  1 tablespoon smooth peanut butter  Lunch  cup vegetable soup (1 carbohydrate serving)  9 saltine crackers (1 carbohydrate serving)   cup applesauce (1 carbohydrate serving)  Afternoon Snack 6 ounces plain soy yogurt (1 carbohydrate servings)   small banana (1 carbohydrate serving)  Evening Meal  cup baked tofu  2/3 cup white rice (2 carbohydrate servings)  2 teaspoons olive oil   cup almond milk fortified with calcium, vitamin B12, and vitamin D  Evening Snack  scoop soy protein powder ( carbohydrate serving)   cup almond milk fortified with calcium, vitamin B12, and vitamin D  Copyright 2020  [employer]y [employer]. All rights reserved.

## 2019-01-27 NOTE — Progress Notes (Signed)
Patient continues to c/o persistent abdominal pain despite frequent PRN administrations of Percocet and Morphine. She wants me to make a note regarding a pain medication she was given while she was @ [facility] recently. I told her I do not know what medication this was but she says it gave her some relief at the time.

## 2019-01-27 NOTE — Progress Notes (Signed)
PROGRESS NOTE    Sheila Padilla  MVH:[id number] DOB: 1960-11-23 DOA: 01/23/2019 PCP: Estell Harpin, Rutha Bouchard, NP      Brief Narrative:  Sheila Padilla is a 58 y.o. F with NIDDM, HTN, smoking and chronic pain on daily opiates for the last year who presents with epigastric pain and vomiting.  The patient has had a pattern of brief episodes of epigastric pain and vomiting, sometimes associated with alcohol, sometimes not, that have occurred over the last 6 months.  She saw GI at [facility], had normal endoscopy.  Never had gastric emptying study.  Not on gastric motilty agent.      Assessment & Plan:  Possible gastroparesis Intractable vomiting episodes, recurrent Nausea better, abdominal pain still severe, required frequent morphine overnight.  No solid food yet. -Continue erythromycin -Continue phenergan PRN -Continue IV fluids -Post-discharge follow up with GI and gastric emptying study -Would like to avoid opiates, but she is chronic daily user, worry about worsening pain, vomiting with withdrawal -Continue pantoprazole twice daily   Diabetes Glucose good -Continue atorvastatin -Continue SSI correction insulin  Hypertension BP elevated   Smoking  Cessation reocmmended  Chronic pain -Continue home Percocet in place of morphine -Counseled to cease long-term  SARS-CoV-2 testing was obtained for screening purposes.  COVID ruled out.     MDM and disposition: The below labs and imaging reports were reviewed and summarized above.  Medication management as above, including frequent IV opiates.    She was admitted with severe intractable vomiting and abdominal pain, without trigger, in the setting of diabetes.  CT abdomen unremarkable, EGD in context of similar flare in the past was nromal.    This appears to be gastroparesis.  She has continued severe nausea precluding any PO intake so far.  Hopefully today, we will continue IV fluids, scheduled antiemetics, no motility  agents, but progress towards tolerating solid for the next 24 hours on discharge.       DVT prophylaxis: Lovenox Code Status: FULL Family Communication: None    Consultants:   None  Procedures:   None  Antimicrobials:   None    Subjective: No vomiting overnight.  Still requiring frequent IV pain medicines.  Still with burning epigastric pain.  No fever, cough, sputum production, chest pain, exertional discomfort, dysuria, hematuria, flank pain.  Objective: Vitals:   01/26/19 0541 01/26/19 1415 01/26/19 2109 01/27/19 0610  BP: (!) 157/91 (!) 169/104 (!) 167/109 (!) 135/93  Pulse: 96 99 98 (!) 111  Resp: Temp: 98.9 F (37.2 C) 99.5 F (37.5 C) 99.2 F (37.3 C) 98.5 F (36.9 C)  TempSrc: Oral Oral Oral Oral  SpO2: 100% 99% 99% 98%  Weight:      Height:        Intake/Output Summary (Last 24 hours) at 01/27/2019 1249 Last data filed at 01/27/2019 0327 Gross per 24 hour  Intake 2720.31 ml  Output 100 ml  Net 2620.31 ml   Filed Weights   01/23/19 1733 01/24/19 0128  Weight: 65.8 kg 64.9 kg    Examination: General appearance: Adult female, lying in bed, sitting up, no acute distress, appears uncomfortable, interactive. HEENT: Anicteric, conjunctival pink, lids and lashes normal, no nasal deformity, discharge, or epistaxis, oropharynx tacky dry, no oral lesions, hearing normal. Skin: Skin warm and dry without jaundice, suspicious rashes, or lesions. Cardiac: Regular rate and rhythm, no murmurs appreciated, JVP normal, no lower extremity edema. Respiratory: Normal respiratory rate and rhythm, lungs clear without rales or wheezes.  Abdomen: Abdomen soft, nonfocal tenderness to palpation, no voluntary guarding or no rigidity, no rebound, no ascites, no distention. MSK: Normal muscle bulk and tone.   Neuro: Alert, extraocular movements intact, moves all extremities with normal strength and coordination, speech fluent.    Psych: Sensorium intact responding to  questions, attention normal, affect normal, judgment and insight appear normal, oriented to person, place, and time.    Data Reviewed: I have personally reviewed following labs and imaging studies:  CBC: Recent Labs  Lab 01/23/19 1300 01/24/19 0347 01/26/19 0425 01/27/19 0420  WBC 8.4 9.2 8.3 7.6  HGB 14.1 14.2 14.1 14.0  HCT 42.3 42.6 41.6 41.5  MCV 91.0 89.7 88.3 88.9  PLT 251 212 251 227   Basic Metabolic Panel: Recent Labs  Lab 01/23/19 1300 01/24/19 0347 01/25/19 0250 01/26/19 0425 01/27/19 0420  NA 136 135 134* 130* 131*  K 3.7 3.3* 4.1 4.3 4.5  CL 101 100 98 98 100  CO2 21* 22 20* 22 20*  GLUCOSE 172* 130* 120* 124* 121*  BUN 10 8 8 6 7   CREATININE 0.90 0.73 0.76 0.73 0.77  CALCIUM 9.6 9.1 9.3 9.4 9.4   GFR: Estimated Creatinine Clearance: 71.5 mL/min (by C-G formula based on SCr of 0.77 mg/dL). Liver Function Tests: Recent Labs  Lab 01/23/19 1300 01/24/19 0347 01/26/19 0425 01/27/19 0420  AST 22 18 16  14*  ALT 16 12 10 9   ALKPHOS 84 76 59 59  BILITOT 1.5* 1.3* 1.2 1.2  PROT 8.2* 8.0 7.5 7.0  ALBUMIN 4.3 4.1 3.7 3.8   Recent Labs  Lab 01/23/19 1300  LIPASE 21   No results for input(s): AMMONIA in the last 168 hours. Coagulation Profile: No results for input(s): INR, PROTIME in the last 168 hours. Cardiac Enzymes: No results for input(s): CKTOTAL, CKMB, CKMBINDEX, TROPONINI in the last 168 hours. BNP (last 3 results) No results for input(s): PROBNP in the last 8760 hours. HbA1C: No results for input(s): HGBA1C in the last 72 hours. CBG: Recent Labs  Lab 01/26/19 1230 01/26/19 1640 01/26/19 2105 01/27/19 0738 01/27/19 1224  GLUCAP 143* 130* 118* 111* 128*   Lipid Profile: No results for input(s): CHOL, HDL, LDLCALC, TRIG, CHOLHDL, LDLDIRECT in the last 72 hours. Thyroid Function Tests: No results for input(s): TSH, T4TOTAL, FREET4, T3FREE, THYROIDAB in the last 72 hours. Anemia Panel: No results for input(s): VITAMINB12, FOLATE,  FERRITIN, TIBC, IRON, RETICCTPCT in the last 72 hours. Urine analysis:    Component Value Date/Time   COLORURINE AMBER (A) 01/23/2019 1258   APPEARANCEUR HAZY (A) 01/23/2019 1258   LABSPEC 1.023 01/23/2019 1258   PHURINE 5.0 01/23/2019 1258   GLUCOSEU NEGATIVE 01/23/2019 1258   HGBUR MODERATE (A) 01/23/2019 1258   BILIRUBINUR NEGATIVE 01/23/2019 1258   KETONESUR 5 (A) 01/23/2019 1258   PROTEINUR 30 (A) 01/23/2019 1258   NITRITE NEGATIVE 01/23/2019 1258   LEUKOCYTESUR TRACE (A) 01/23/2019 1258   Sepsis Labs: @LABRCNTIP (procalcitonin:4,lacticacidven:4)  ) Recent Results (from the past 240 hour(s))  SARS Coronavirus 2 (CEPHEID - Performed in [facility] hospital lab), Hosp Order     Status: None   Collection Time: 01/23/19  9:01 PM  Result Value Ref Range Status   SARS Coronavirus 2 NEGATIVE NEGATIVE Final    Comment: (NOTE) If result is NEGATIVE SARS-CoV-2 target nucleic acids are NOT DETECTED. The SARS-CoV-2 RNA is generally detectable in upper and lower  respiratory specimens during the acute phase of infection. The lowest  concentration of SARS-CoV-2 viral copies this  assay can detect is 250  copies / mL. A negative result does not preclude SARS-CoV-2 infection  and should not be used as the sole basis for treatment or other  patient management decisions.  A negative result may occur with  improper specimen collection / handling, submission of specimen other  than nasopharyngeal swab, presence of viral mutation(s) within the  areas targeted by this assay, and inadequate number of viral copies  (<250 copies / mL). A negative result must be combined with clinical  observations, patient history, and epidemiological information. If result is POSITIVE SARS-CoV-2 target nucleic acids are DETECTED. The SARS-CoV-2 RNA is generally detectable in upper and lower  respiratory specimens dur ing the acute phase of infection.  Positive  results are indicative of active infection with  SARS-CoV-2.  Clinical  correlation with patient history and other diagnostic information is  necessary to determine patient infection status.  Positive results do  not rule out bacterial infection or co-infection with other viruses. If result is PRESUMPTIVE POSTIVE SARS-CoV-2 nucleic acids MAY BE PRESENT.   A presumptive positive result was obtained on the submitted specimen  and confirmed on repeat testing.  While 2019 novel coronavirus  (SARS-CoV-2) nucleic acids may be present in the submitted sample  additional confirmatory testing may be necessary for epidemiological  and / or clinical management purposes  to differentiate between  SARS-CoV-2 and other Sarbecovirus currently known to infect humans.  If clinically indicated additional testing with an alternate test  methodology [phone]) is advised. The SARS-CoV-2 RNA is generally  detectable in upper and lower respiratory sp ecimens during the acute  phase of infection. The expected result is Negative. Fact Sheet for Patients:  BoilerBrush.com.cy Fact Sheet for Healthcare Providers: https://pope.com/ This test is not yet approved or cleared by the [place] FDA and has been authorized for detection and/or diagnosis of SARS-CoV-2 by FDA under an Emergency Use Authorization (EUA).  This EUA will remain in effect (meaning this test can be used) for the duration of the COVID-19 declaration under Section 564(b)(1) of the Act, 21 U.S.C. section 360bbb-3(b)(1), unless the authorization is terminated or revoked sooner. Performed at [facility], [address] [place], [place] [address]   Urine culture     Status: Abnormal   Collection Time: 01/23/19 10:38 PM  Result Value Ref Range Status   Specimen Description URINE, RANDOM  Final   Special Requests NONE  Final   Culture (A)  Final    <10,000 COLONIES/mL INSIGNIFICANT GROWTH Performed at [facility], [address] [place], [place] [address]    Report Status 01/24/2019 FINAL  Final         Radiology Studies: No results found.      Scheduled Meds: . atorvastatin  40 mg Oral Daily  . enoxaparin (LOVENOX) injection  40 mg Subcutaneous Q24H  . feeding supplement (ENSURE ENLIVE)  237 mL Oral BID BM  . insulin aspart  0-15 Units Subcutaneous TID WC  . insulin aspart  0-5 Units Subcutaneous QHS  . multivitamin with minerals  1 tablet Oral Daily  . pantoprazole  40 mg Oral BID  . sodium chloride flush  3 mL Intravenous Once  . traZODone  100 mg Oral QHS   Continuous Infusions: . 0.9 % NaCl with KCl 40 mEq / L 125 mL/hr (01/27/19 1206)  . erythromycin 250 mg (01/27/19 0538)     LOS: 4 days    Time spent: 35 minutes  Alberteen Samhristopher P Denver Bentson, MD [facility] 01/27/2019, 12:49 PM     Please page through AMION:  [email or web address] Password [email or web address] If 7PM-7AM, please contact night-coverage

## 2019-01-28 DIAGNOSIS — E1143 Type 2 diabetes mellitus with diabetic autonomic (poly)neuropathy: Principal | ICD-10-CM

## 2019-01-28 DIAGNOSIS — K3184 Gastroparesis: Secondary | ICD-10-CM

## 2019-01-28 LAB — COMPREHENSIVE METABOLIC PANEL
ALT: 11 U/L (ref 0–44)
AST: 18 U/L (ref 15–41)
Albumin: 3.7 g/dL (ref 3.5–5.0)
Alkaline Phosphatase: 57 U/L (ref 38–126)
Anion gap: 12 (ref 5–15)
BUN: 7 mg/dL (ref 6–20)
CO2: 23 mmol/L (ref 22–32)
Calcium: 9.6 mg/dL (ref 8.9–10.3)
Chloride: 96 mmol/L — ABNORMAL LOW (ref 98–111)
Creatinine, Ser: 0.72 mg/dL (ref 0.44–1.00)
GFR calc Af Amer: >60 mL/min (ref >60)
GFR calc non Af Amer: >60 mL/min (ref >60)
Glucose, Bld: 130 mg/dL — ABNORMAL HIGH (ref 70–99)
Potassium: 4.7 mmol/L (ref 3.5–5.1)
Sodium: 131 mmol/L — ABNORMAL LOW (ref 135–145)
Total Bilirubin: 0.9 mg/dL (ref 0.3–1.2)
Total Protein: 7.2 g/dL (ref 6.5–8.1)

## 2019-01-28 LAB — CBC
HCT: 40.2 % (ref 36.0–46.0)
Hemoglobin: 13.6 g/dL (ref 12.0–15.0)
MCH: 30.2 pg (ref 26.0–34.0)
MCHC: 33.8 g/dL (ref 30.0–36.0)
MCV: 89.1 fL (ref 80.0–100.0)
Platelets: 217 10*3/uL (ref 150–400)
RBC: 4.51 MIL/uL (ref 3.87–5.11)
RDW: 11.8 % (ref 11.5–15.5)
WBC: 7.4 10*3/uL (ref 4.0–10.5)
nRBC: 0 % (ref 0.0–0.2)

## 2019-01-28 LAB — GLUCOSE, CAPILLARY
Glucose-Capillary: 166 mg/dL — ABNORMAL HIGH (ref 70–99)
Glucose-Capillary: 120 mg/dL — ABNORMAL HIGH (ref 70–99)
Glucose-Capillary: 119 mg/dL — ABNORMAL HIGH (ref 70–99)

## 2019-01-28 MED ORDER — OXYCODONE-ACETAMINOPHEN 7.5-325 MG PO TABS
1.0000 | ORAL_TABLET | ORAL | Status: DC | PRN
  Administered 2019-01-28 (×2): 1 via ORAL
  Filled 2019-01-28: qty 1

## 2019-01-28 MED ORDER — METOCLOPRAMIDE HCL 10 MG PO TABS: 10.0000 mg | ORAL_TABLET | Freq: Three times a day (TID) | ORAL | 1 refills | Status: AC

## 2019-01-28 MED ORDER — ONDANSETRON HCL 4 MG PO TABS: 4.0000 mg | ORAL_TABLET | Freq: Four times a day (QID) | ORAL | 0 refills | Status: DC | PRN

## 2019-01-28 MED ORDER — ERYTHROMYCIN BASE 250 MG PO TABS: 250.0000 mg | ORAL_TABLET | Freq: Three times a day (TID) | ORAL | 0 refills | Status: AC

## 2019-01-28 NOTE — Discharge Summary (Addendum)
Physician Discharge Summary  Jordin Dambrosio RDE:[id number] DOB: [birth year] DOA: 01/23/2019  PCP: Magdalene Molly, Inda Merlin, NP  Admit date: 01/23/2019 Discharge date: 01/28/2019  Admitted From: Home  Disposition:  Home   Recommendations for Outpatient Follow-up:  1. Follow up with PCP in 1 week 2. Please obtain BMP/CBC in one week 3. Follow up with GI, Dr. Mary Sella in 4-6 weeks     Home Health: None  Equipment/Devices: None  Discharge Condition: Good  CODE STATUS: FULL Diet recommendation: Diabetic  Brief/Interim Summary: Mrs. Copen is a 58 y.o. F with NIDDM, HTN, smoking and chronic pain on daily opiates for the last year who presents with epigastric pain and vomiting.  The patient has had a pattern of brief episodes of epigastric pain and vomiting, sometimes associated with alcohol, sometimes not, that have occurred over the last 6 months.  She saw GI at [facility], had normal endoscopy.  Never had gastric emptying study.  Not on gastric motilty agent.     PRINCIPAL HOSPITAL DIAGNOSIS: Likely diabetic gastroparesis    Discharge Diagnoses:   Possible gastroparesis Intractable vomiting episodes, recurrent Patient admitted and started on IV fluids, Reglan.  Unable to tolerate PO and frequent pain and vomiting, and so Reglan changed to erythromycin IV with gradual improvement over 48 hours.  In last 24 hours, able to tolerate solid food.  IV fluids stopped.  Counseled on stopping opiates.  Recommended follow up with her GI, Dr. Mary Sella. -Discharged with Reglan scheduled and PRN Zofran    Diabetes  Hypertension   Smoking  Cessation reocmmended  Chronic pain Counseled on opiates and GI motility, recommended tapering and cessation.    SARS-CoV-2 testing was obtained for screening purposes.  COVID ruled out.             Discharge Instructions  Discharge Instructions    Diet Carb Modified   Complete by:  As directed    Discharge instructions    Complete by:  As directed    From Dr. Loleta Books: You were admitted with vomiting and belly pain.  Vomiting can be caused by many things, but our CAT scan showed no problems in the stomach, gallbladder or intestines, and you had no other obvious explanation for your symptoms on your lab work. Under circumstances like this where other causes have been excluded, we narrow the possibilities down to "diabetic gastroparesis" and "cyclic vomiting syndrome"  I recommend you use EITHER Reglan or erythromycin for gastric motility (promoting gastric emptying and suppressing nausea)  If the erythromycin 250 mg is not too costly, then use this: take erythromycin 250 mg 2-3 times daily before meals  If the erythromycin is expensive, then fill the Reglan/metoclopramide instead.  Do not take both.  Eat small meals, and search the internet for other diet advice for patient's with gastroparesis.  In addition to Reglan OR erythromycin 2-3 times daily on schedule, I recommend you take ondansetron/Zofran as needed for nausea  Call Dr. Astrid Drafts office for a GI follow up in the next 1-2 months   And remember, in the long term, opiates like Percocet slow gastric emptying, and should be tapered and stopped.   Increase activity slowly   Complete by:  As directed      Allergies as of 01/28/2019      Reactions   Celecoxib Rash   Stomach pain      Medication List    TAKE these medications   AgaMatrix Presto w/Device Kit Check blood glucose twice daily before meals  AgaMatrix Ultra-Thin Lancets Misc Check blood glucose twice daily before meals   atorvastatin 40 MG tablet Commonly known as:  LIPITOR Take 1 tablet (40 mg total) by mouth daily.   erythromycin 250 MG tablet Commonly known as:  E-MYCIN Take 1 tablet (250 mg total) by mouth 3 (three) times daily for 30 days.   gabapentin 300 MG capsule Commonly known as:  NEURONTIN Take 600 mg by mouth at bedtime. What changed:  Another medication  with the same name was removed. Continue taking this medication, and follow the directions you see here.   glucose blood test strip Commonly known as:  AgaMatrix Presto Test Check blood glucose twice daily before meals   hydrOXYzine 25 MG tablet Commonly known as:  ATARAX/VISTARIL Take 25 mg by mouth every 8 (eight) hours as needed for anxiety.   metFORMIN 500 MG tablet Commonly known as:  GLUCOPHAGE TAKE 1 TABLET BY MOUTH TWICE DAILY WITH A MEAL What changed:  See the new instructions.   metoCLOPramide 10 MG tablet Commonly known as:  REGLAN Take 1 tablet (10 mg total) by mouth 3 (three) times daily with meals.   omeprazole 40 MG capsule Commonly known as:  PRILOSEC Take 1 capsule (40 mg total) by mouth daily.   ondansetron 4 MG tablet Commonly known as:  ZOFRAN Take 1 tablet (4 mg total) by mouth every 6 (six) hours as needed for nausea.   oxyCODONE-acetaminophen 7.5-325 MG tablet Commonly known as:  PERCOCET Take 1 tablet by mouth 3 (three) times daily.   traZODone 100 MG tablet Commonly known as:  DESYREL Take 1 tablet (100 mg total) by mouth at bedtime. What changed:  how much to take       Allergies  Allergen Reactions  . Celecoxib Rash    Stomach pain    Consultations:  None   Procedures/Studies: Ct Abdomen Pelvis W Contrast  Result Date: 01/24/2019 CLINICAL DATA:  Gastroenteritis, abdominal pain EXAM: CT ABDOMEN AND PELVIS WITH CONTRAST TECHNIQUE: Multidetector CT imaging of the abdomen and pelvis was performed using the standard protocol following bolus administration of intravenous contrast. CONTRAST:  115m OMNIPAQUE IOHEXOL 300 MG/ML  SOLN COMPARISON:  08/08/2018 FINDINGS: Lower chest: No acute abnormality. Hepatobiliary: No focal liver abnormality is seen. Status post cholecystectomy. No biliary dilatation. Pancreas: Unremarkable. No pancreatic ductal dilatation or surrounding inflammatory changes. Spleen: Normal in size without focal abnormality.  Adrenals/Urinary Tract: Adrenal glands are unremarkable. Kidneys are normal, without renal calculi, focal lesion, or hydronephrosis. Bladder is unremarkable. Stomach/Bowel: Stomach is within normal limits. Appendix appears normal. No evidence of bowel wall thickening, distention, or inflammatory changes. Vascular/Lymphatic: No significant vascular findings are present. No enlarged abdominal or pelvic lymph nodes. Reproductive: Status post hysterectomy. No adnexal masses. Other: No abdominal wall hernia or abnormality. No abdominopelvic ascites. Musculoskeletal: No acute or significant osseous findings. IMPRESSION: 1. No acute abdominal or pelvic pathology. Electronically Signed   By: HKathreen Devoid  On: 01/24/2019 16:49      Subjective: Still moderate epigastric pain, still decreased appetite, but no vomiting with oral intake today.  No fever, no confusion.  No diarrhea at any point. No distension     Discharge Exam: Vitals:   01/27/19 2256 01/28/19 0518  BP: (!) 155/85 (!) 151/87  Pulse: (!) 102 (!) 102  Resp: 16 16  Temp: 99 F (37.2 C) 98.5 F (36.9 C)  SpO2: 100% 99%   Vitals:   01/27/19 0610 01/27/19 1636 01/27/19 2256 01/28/19 0518  BP: (!) 135/93 (Marland Kitchen  141/83 (!) 155/85 (!) 151/87  Pulse: (!) 111 99 (!) 102 (!) 102  Resp:  20 16 16   Temp: 98.5 F (36.9 C) 98.2 F (36.8 C) 99 F (37.2 C) 98.5 F (36.9 C)  TempSrc: Oral Oral Oral Oral  SpO2: 98% 97% 100% 99%  Weight:      Height:        General: Pt is alert, awake, not in acute distress Cardiovascular: RRR, nl S1-S2, no murmurs appreciated.   No LE edema.   Respiratory: Normal respiratory rate and rhythm.  CTAB without rales or wheezes. Abdominal: Abdomen soft and non-tender.  No distension or HSM.   Neuro/Psych: Strength symmetric in upper and lower extremities.  Judgment and insight appear normal.   The results of significant diagnostics from this hospitalization (including imaging, microbiology, ancillary and  laboratory) are listed below for reference.     Microbiology: Recent Results (from the past 240 hour(s))  SARS Coronavirus 2 (CEPHEID - Performed in [facility] hospital lab), Hosp Order     Status: None   Collection Time: 01/23/19  9:01 PM  Result Value Ref Range Status   SARS Coronavirus 2 NEGATIVE NEGATIVE Final    Comment: (NOTE) If result is NEGATIVE SARS-CoV-2 target nucleic acids are NOT DETECTED. The SARS-CoV-2 RNA is generally detectable in upper and lower  respiratory specimens during the acute phase of infection. The lowest  concentration of SARS-CoV-2 viral copies this assay can detect is 250  copies / mL. A negative result does not preclude SARS-CoV-2 infection  and should not be used as the sole basis for treatment or other  patient management decisions.  A negative result may occur with  improper specimen collection / handling, submission of specimen other  than nasopharyngeal swab, presence of viral mutation(s) within the  areas targeted by this assay, and inadequate number of viral copies  (<250 copies / mL). A negative result must be combined with clinical  observations, patient history, and epidemiological information. If result is POSITIVE SARS-CoV-2 target nucleic acids are DETECTED. The SARS-CoV-2 RNA is generally detectable in upper and lower  respiratory specimens dur ing the acute phase of infection.  Positive  results are indicative of active infection with SARS-CoV-2.  Clinical  correlation with patient history and other diagnostic information is  necessary to determine patient infection status.  Positive results do  not rule out bacterial infection or co-infection with other viruses. If result is PRESUMPTIVE POSTIVE SARS-CoV-2 nucleic acids MAY BE PRESENT.   A presumptive positive result was obtained on the submitted specimen  and confirmed on repeat testing.  While 2019 novel coronavirus  (SARS-CoV-2) nucleic acids may be present in the submitted  sample  additional confirmatory testing may be necessary for epidemiological  and / or clinical management purposes  to differentiate between  SARS-CoV-2 and other Sarbecovirus currently known to infect humans.  If clinically indicated additional testing with an alternate test  methodology [phone]) is advised. The SARS-CoV-2 RNA is generally  detectable in upper and lower respiratory sp ecimens during the acute  phase of infection. The expected result is Negative. Fact Sheet for Patients:  StrictlyIdeas.no Fact Sheet for Healthcare Providers: BankingDealers.co.za This test is not yet approved or cleared by the [place] FDA and has been authorized for detection and/or diagnosis of SARS-CoV-2 by FDA under an Emergency Use Authorization (EUA).  This EUA will remain in effect (meaning this test can be used) for the duration of the COVID-19 declaration under Section 564(b)(1) of the  Act, 21 U.S.C. section 360bbb-3(b)(1), unless the authorization is terminated or revoked sooner. Performed at [facility], [place] [address] [place], [place] [address]   Urine culture     Status: Abnormal   Collection Time: 01/23/19 10:38 PM  Result Value Ref Range Status   Specimen Description URINE, RANDOM  Final   Special Requests NONE  Final   Culture (A)  Final    <10,000 COLONIES/mL INSIGNIFICANT GROWTH Performed at [facility] [address] [place], [place] [address]    Report Status 01/24/2019 FINAL  Final     Labs: BNP (last 3 results) No results for input(s): BNP in the last 8760 hours. Basic Metabolic Panel: Recent Labs  Lab 01/24/19 0347 01/25/19 0250 01/26/19 0425 01/27/19 0420 01/28/19 0212  NA 135 134* 130* 131* 131*  K 3.3* 4.1 4.3 4.5 4.7  CL 100 98 98 100 96*  CO2 22 20* 22 20* 23  GLUCOSE 130* 120* 124* 121* 130*  BUN 8 8 6 7 7   CREATININE 0.73 0.76 0.73 0.77 0.72  CALCIUM 9.1 9.3 9.4 9.4 9.6   Liver  Function Tests: Recent Labs  Lab 01/23/19 1300 01/24/19 0347 01/26/19 0425 01/27/19 0420 01/28/19 0212  AST 22 18 16  14* 18  ALT 16 12 10 9 11   ALKPHOS 84 76 59 59 57  BILITOT 1.5* 1.3* 1.2 1.2 0.9  PROT 8.2* 8.0 7.5 7.0 7.2  ALBUMIN 4.3 4.1 3.7 3.8 3.7   Recent Labs  Lab 01/23/19 1300  LIPASE 21   No results for input(s): AMMONIA in the last 168 hours. CBC: Recent Labs  Lab 01/23/19 1300 01/24/19 0347 01/26/19 0425 01/27/19 0420 01/28/19 0212  WBC 8.4 9.2 8.3 7.6 7.4  HGB 14.1 14.2 14.1 14.0 13.6  HCT 42.3 42.6 41.6 41.5 40.2  MCV 91.0 89.7 88.3 88.9 89.1  PLT 251 212 251 227 217   Cardiac Enzymes: No results for input(s): CKTOTAL, CKMB, CKMBINDEX, TROPONINI in the last 168 hours. BNP: Invalid input(s): POCBNP CBG: Recent Labs  Lab 01/27/19 1224 01/27/19 1631 01/28/19 0414 01/28/19 0755 01/28/19 1156  GLUCAP 128* 158* 166* 120* 119*   D-Dimer No results for input(s): DDIMER in the last 72 hours. Hgb A1c No results for input(s): HGBA1C in the last 72 hours. Lipid Profile No results for input(s): CHOL, HDL, LDLCALC, TRIG, CHOLHDL, LDLDIRECT in the last 72 hours. Thyroid function studies No results for input(s): TSH, T4TOTAL, T3FREE, THYROIDAB in the last 72 hours.  Invalid input(s): FREET3 Anemia work up No results for input(s): VITAMINB12, FOLATE, FERRITIN, TIBC, IRON, RETICCTPCT in the last 72 hours. Urinalysis    Component Value Date/Time   COLORURINE AMBER (A) 01/23/2019 1258   APPEARANCEUR HAZY (A) 01/23/2019 1258   LABSPEC 1.023 01/23/2019 1258   PHURINE 5.0 01/23/2019 1258   GLUCOSEU NEGATIVE 01/23/2019 1258   HGBUR MODERATE (A) 01/23/2019 1258   BILIRUBINUR NEGATIVE 01/23/2019 1258   KETONESUR 5 (A) 01/23/2019 1258   PROTEINUR 30 (A) 01/23/2019 1258   NITRITE NEGATIVE 01/23/2019 1258   LEUKOCYTESUR TRACE (A) 01/23/2019 1258   Sepsis Labs Invalid input(s): PROCALCITONIN,  WBC,  LACTICIDVEN Microbiology Recent Results (from the past  240 hour(s))  SARS Coronavirus 2 (CEPHEID - Performed in [facility] hospital lab), Hosp Order     Status: None   Collection Time: 01/23/19  9:01 PM  Result Value Ref Range Status   SARS Coronavirus 2 NEGATIVE NEGATIVE Final    Comment: (NOTE) If result is NEGATIVE SARS-CoV-2  target nucleic acids are NOT DETECTED. The SARS-CoV-2 RNA is generally detectable in upper and lower  respiratory specimens during the acute phase of infection. The lowest  concentration of SARS-CoV-2 viral copies this assay can detect is 250  copies / mL. A negative result does not preclude SARS-CoV-2 infection  and should not be used as the sole basis for treatment or other  patient management decisions.  A negative result may occur with  improper specimen collection / handling, submission of specimen other  than nasopharyngeal swab, presence of viral mutation(s) within the  areas targeted by this assay, and inadequate number of viral copies  (<250 copies / mL). A negative result must be combined with clinical  observations, patient history, and epidemiological information. If result is POSITIVE SARS-CoV-2 target nucleic acids are DETECTED. The SARS-CoV-2 RNA is generally detectable in upper and lower  respiratory specimens dur ing the acute phase of infection.  Positive  results are indicative of active infection with SARS-CoV-2.  Clinical  correlation with patient history and other diagnostic information is  necessary to determine patient infection status.  Positive results do  not rule out bacterial infection or co-infection with other viruses. If result is PRESUMPTIVE POSTIVE SARS-CoV-2 nucleic acids MAY BE PRESENT.   A presumptive positive result was obtained on the submitted specimen  and confirmed on repeat testing.  While 2019 novel coronavirus  (SARS-CoV-2) nucleic acids may be present in the submitted sample  additional confirmatory testing may be necessary for epidemiological  and / or clinical  management purposes  to differentiate between  SARS-CoV-2 and other Sarbecovirus currently known to infect humans.  If clinically indicated additional testing with an alternate test  methodology [phone]) is advised. The SARS-CoV-2 RNA is generally  detectable in upper and lower respiratory sp ecimens during the acute  phase of infection. The expected result is Negative. Fact Sheet for Patients:  StrictlyIdeas.no Fact Sheet for Healthcare Providers: BankingDealers.co.za This test is not yet approved or cleared by the [place] FDA and has been authorized for detection and/or diagnosis of SARS-CoV-2 by FDA under an Emergency Use Authorization (EUA).  This EUA will remain in effect (meaning this test can be used) for the duration of the COVID-19 declaration under Section 564(b)(1) of the Act, 21 U.S.C. section 360bbb-3(b)(1), unless the authorization is terminated or revoked sooner. Performed at [facility], [place] [address], [place], [place] [address]   Urine culture     Status: Abnormal   Collection Time: 01/23/19 10:38 PM  Result Value Ref Range Status   Specimen Description URINE, RANDOM  Final   Special Requests NONE  Final   Culture (A)  Final    <10,000 COLONIES/mL INSIGNIFICANT GROWTH Performed at [facility] [address] [place], [place] [address]    Report Status 01/24/2019 FINAL  Final     Time coordinating discharge: 40 minutes       SIGNED:   Edwin Dada, MD  [facility] 01/28/2019, 3:10 PM

## 2019-01-29 ENCOUNTER — Emergency Department (HOSPITAL_COMMUNITY): Payer: Medicare Other

## 2019-01-29 ENCOUNTER — Other Ambulatory Visit: Payer: Self-pay

## 2019-01-29 ENCOUNTER — Encounter (HOSPITAL_COMMUNITY): Payer: Self-pay | Admitting: Oncology

## 2019-01-29 ENCOUNTER — Emergency Department (HOSPITAL_COMMUNITY)
Admission: EM | Admit: 2019-01-29 | Discharge: 2019-01-29 | Disposition: A | Discharge status: Home / Self Care | Payer: Medicare Other | Attending: Emergency Medicine | Admitting: Emergency Medicine

## 2019-01-29 DIAGNOSIS — F17210 Nicotine dependence, cigarettes, uncomplicated: Secondary | ICD-10-CM | POA: Insufficient documentation

## 2019-01-29 DIAGNOSIS — Y92013 Bedroom of single-family (private) house as the place of occurrence of the external cause: Secondary | ICD-10-CM | POA: Diagnosis not present

## 2019-01-29 DIAGNOSIS — R55 Syncope and collapse: Secondary | ICD-10-CM

## 2019-01-29 DIAGNOSIS — Z96652 Presence of left artificial knee joint: Secondary | ICD-10-CM | POA: Diagnosis not present

## 2019-01-29 DIAGNOSIS — Z79899 Other long term (current) drug therapy: Secondary | ICD-10-CM | POA: Diagnosis not present

## 2019-01-29 DIAGNOSIS — Z7984 Long term (current) use of oral hypoglycemic drugs: Secondary | ICD-10-CM | POA: Diagnosis not present

## 2019-01-29 DIAGNOSIS — W0110XA Fall on same level from slipping, tripping and stumbling with subsequent striking against unspecified object, initial encounter: Secondary | ICD-10-CM | POA: Insufficient documentation

## 2019-01-29 DIAGNOSIS — E119 Type 2 diabetes mellitus without complications: Secondary | ICD-10-CM | POA: Insufficient documentation

## 2019-01-29 DIAGNOSIS — S161XXA Strain of muscle, fascia and tendon at neck level, initial encounter: Secondary | ICD-10-CM | POA: Diagnosis not present

## 2019-01-29 DIAGNOSIS — S0990XA Unspecified injury of head, initial encounter: Secondary | ICD-10-CM | POA: Diagnosis present

## 2019-01-29 DIAGNOSIS — Y9389 Activity, other specified: Secondary | ICD-10-CM | POA: Insufficient documentation

## 2019-01-29 DIAGNOSIS — S060X1A Concussion with loss of consciousness of 30 minutes or less, initial encounter: Secondary | ICD-10-CM | POA: Insufficient documentation

## 2019-01-29 DIAGNOSIS — Z9049 Acquired absence of other specified parts of digestive tract: Secondary | ICD-10-CM | POA: Diagnosis not present

## 2019-01-29 DIAGNOSIS — Y998 Other external cause status: Secondary | ICD-10-CM | POA: Insufficient documentation

## 2019-01-29 DIAGNOSIS — F329 Major depressive disorder, single episode, unspecified: Secondary | ICD-10-CM | POA: Insufficient documentation

## 2019-01-29 DIAGNOSIS — S060X9A Concussion with loss of consciousness of unspecified duration, initial encounter: Secondary | ICD-10-CM

## 2019-01-29 DIAGNOSIS — I10 Essential (primary) hypertension: Secondary | ICD-10-CM | POA: Diagnosis not present

## 2019-01-29 DIAGNOSIS — S39012A Strain of muscle, fascia and tendon of lower back, initial encounter: Secondary | ICD-10-CM | POA: Insufficient documentation

## 2019-01-29 LAB — CBC WITH DIFFERENTIAL/PLATELET
Abs Immature Granulocytes: 0.04 10*3/uL (ref 0.00–0.07)
Basophils Absolute: 0 10*3/uL (ref 0.0–0.1)
Basophils Relative: 0 %
Eosinophils Absolute: 0.2 10*3/uL (ref 0.0–0.5)
Eosinophils Relative: 2 %
HCT: 37.6 % (ref 36.0–46.0)
Hemoglobin: 12 g/dL (ref 12.0–15.0)
Immature Granulocytes: 0 %
Lymphocytes Relative: 28 %
Lymphs Abs: 2.7 10*3/uL (ref 0.7–4.0)
MCH: 29.8 pg (ref 26.0–34.0)
MCHC: 31.9 g/dL (ref 30.0–36.0)
MCV: 93.3 fL (ref 80.0–100.0)
Monocytes Absolute: 1 10*3/uL (ref 0.1–1.0)
Monocytes Relative: 10 %
Neutro Abs: 5.7 10*3/uL (ref 1.7–7.7)
Neutrophils Relative %: 60 %
Platelets: 224 10*3/uL (ref 150–400)
RBC: 4.03 MIL/uL (ref 3.87–5.11)
RDW: 11.8 % (ref 11.5–15.5)
WBC: 9.7 10*3/uL (ref 4.0–10.5)
nRBC: 0 % (ref 0.0–0.2)

## 2019-01-29 LAB — BASIC METABOLIC PANEL
Anion gap: 12 (ref 5–15)
BUN: 15 mg/dL (ref 6–20)
CO2: 21 mmol/L — ABNORMAL LOW (ref 22–32)
Calcium: 9.7 mg/dL (ref 8.9–10.3)
Chloride: 100 mmol/L (ref 98–111)
Creatinine, Ser: 1.19 mg/dL — ABNORMAL HIGH (ref 0.44–1.00)
GFR calc Af Amer: 60 mL/min — ABNORMAL LOW (ref >60)
GFR calc non Af Amer: 51 mL/min — ABNORMAL LOW (ref >60)
Glucose, Bld: 142 mg/dL — ABNORMAL HIGH (ref 70–99)
Potassium: 3.8 mmol/L (ref 3.5–5.1)
Sodium: 133 mmol/L — ABNORMAL LOW (ref 135–145)

## 2019-01-29 MED ORDER — SODIUM CHLORIDE 0.9 % IV BOLUS (SEPSIS)
1000.0000 mL | Freq: Once | INTRAVENOUS | Status: AC
  Administered 2019-01-29: 1000 mL via INTRAVENOUS

## 2019-01-29 NOTE — ED Notes (Signed)
Patient transported to X-ray 

## 2019-01-29 NOTE — ED Provider Notes (Signed)
[facility] EMERGENCY DEPARTMENT Provider Note   CSN: [id number] Arrival date & time: 01/29/19  0020    History   Chief Complaint Chief Complaint  Patient presents with  . Loss of Consciousness    HPI Sheila Padilla is a 58 y.o. female.     The history is provided by the patient.  Loss of Consciousness  Most recent episode:  Today Progression:  Improving Chronicity:  New Context: medication change   Relieved by: Rest. Exacerbated by: Standing up. Associated symptoms: dizziness, headaches and recent fall   Associated symptoms: no chest pain, no fever, no focal weakness, no seizures, no shortness of breath and no vomiting    Patient presents from home after 2 falls.  She reports while lying in bed she felt okay, but when she stood up to go the bathroom she felt lightheaded, her vision changed, and she fell.  She thinks she had LOC.  She recalls hitting her head.  She has neck and back pain.  No new chest, no new abdominal pain.  No focal weakness.  No incontinence.  No seizures Just got discharged from the hospital, with medication changes.  She denies any new vomiting or diarrhea.  No alcohol use.  She reports little p.o. intake  EMS reports her blood pressure was 70/50 in the field Past Medical History:  Diagnosis Date  . Depression 2014   and anxiety  . Diabetes mellitus without complication ([place]) 9211  . Hyperlipidemia 2014  . Insomnia   . Tobacco abuse age 70    Patient Active Problem List   Diagnosis Date Noted  . Chronic pain 01/23/2019  . Benign essential HTN 01/23/2019  . Nausea and/or vomiting 01/23/2019  . UTI (urinary tract infection) 01/23/2019  . Insomnia   . Tobacco abuse   . Type II diabetes mellitus ([place]) 12/29/2013  . Diabetes mellitus without complication ([place]) 94/17/4081  . Hyperlipidemia 09/21/2012  . Depression 09/21/2012    Past Surgical History:  Procedure Laterality Date  . BREAST CYST EXCISION Right teen   twice in  teenage years  . CHOLECYSTECTOMY  1987   open  . REPLACEMENT TOTAL KNEE Left 2017     OB History   No obstetric history on file.      Home Medications    Prior to Admission medications   Medication Sig Start Date End Date Taking? Authorizing Provider  Truddie Crumble ULTRA-THIN LANCETS MISC Check blood glucose twice daily before meals 10/28/17   Mack Hook, MD  atorvastatin (LIPITOR) 40 MG tablet Take 1 tablet (40 mg total) by mouth daily. 10/13/17   Mack Hook, MD  Blood Glucose Monitoring Suppl (AGAMATRIX PRESTO) w/Device KIT Check blood glucose twice daily before meals 10/28/17   Mack Hook, MD  erythromycin base (E-MYCIN) 250 MG tablet Take 1 tablet (250 mg total) by mouth 3 (three) times daily for 30 days. 01/28/19 02/27/19  Danford, Suann Larry, MD  gabapentin (NEURONTIN) 300 MG capsule Take 600 mg by mouth at bedtime.  07/07/18   [provider]  glucose blood (AGAMATRIX PRESTO TEST) test strip Check blood glucose twice daily before meals 10/28/17   Mack Hook, MD  hydrOXYzine (ATARAX/VISTARIL) 25 MG tablet Take 25 mg by mouth every 8 (eight) hours as needed for anxiety.  07/05/18   [provider]  metFORMIN (GLUCOPHAGE) 500 MG tablet TAKE 1 TABLET BY MOUTH TWICE DAILY WITH A MEAL Patient taking differently: Take 500 mg by mouth 2 (two) times daily with a meal.  07/03/18   Mack Hook, MD  metoCLOPramide (REGLAN) 10 MG tablet Take 1 tablet (10 mg total) by mouth 3 (three) times daily with meals. 01/28/19 03/29/19  Danford, Suann Larry, MD  omeprazole (PRILOSEC) 40 MG capsule Take 1 capsule (40 mg total) by mouth daily. 08/08/18 01/25/19  Jacqlyn Larsen, PA-C  ondansetron (ZOFRAN) 4 MG tablet Take 1 tablet (4 mg total) by mouth every 6 (six) hours as needed for nausea. 01/28/19   Danford, Suann Larry, MD  oxyCODONE-acetaminophen (PERCOCET) 7.5-325 MG tablet Take 1 tablet by mouth 3 (three) times daily.    [provider]   traZODone (DESYREL) 100 MG tablet Take 1 tablet (100 mg total) by mouth at bedtime. Patient taking differently: Take 150 mg by mouth at bedtime.  10/13/17   Mack Hook, MD    Family History No family history on file.  Social History Social History   Tobacco Use  . Smoking status: Current Every Day Smoker    Packs/day: 0.50    Years: 38.00    Pack years: 19.00    Types: Cigarettes  . Smokeless tobacco: Never Used  Substance Use Topics  . Alcohol use: Yes  . Drug use: Not Currently     Allergies   Celecoxib   Review of Systems Review of Systems  Constitutional: Positive for fatigue. Negative for fever.  Eyes: Positive for visual disturbance.       Blurred vision just prior to fall  Respiratory: Negative for shortness of breath.   Cardiovascular: Positive for syncope. Negative for chest pain.  Gastrointestinal: Negative for vomiting.  Musculoskeletal: Positive for arthralgias, back pain and neck pain.  Neurological: Positive for dizziness, light-headedness and headaches. Negative for focal weakness and seizures.       Denies focal weakness  All other systems reviewed and are negative.    Physical Exam Updated Vital Signs BP 91/62 (BP Location: Right Arm)   Pulse (!) 107   Temp 98.2 F (36.8 C) (Oral)   Resp (!) 21   Ht 1.651 m (5' 5" )   Wt 64 kg   LMP  (LMP Unknown)   SpO2 97%   BMI 23.48 kg/m   Physical Exam CONSTITUTIONAL: Well developed/well nourished HEAD: Normocephalic/atraumatic no signs of trauma EYES: EOMI/PERRL ENMT: Mucous membranes moist NECK: C-collar in place SPINE/BACK: Cervical spine tenderness, no thoracic tenderness, upper lumbar tenderness noted, bruising noted to paraspinal region on left lumbar region CV: S1/S2 noted, no murmurs/rubs/gallops noted LUNGS: Lungs are clear to auscultation bilaterally, no apparent distress ABDOMEN: soft, nontender, no rebound or guarding, bowel sounds noted throughout abdomen GU:no cva tenderness  NEURO: Pt is awake/alert/appropriate, moves all extremitiesx4.  No facial droop.  No arm or leg drift.  Speech is fluent.  No mental status changes EXTREMITIES: pulses normal/equal, full ROM, all other extremities/joints palpated/ranged and nontender SKIN: warm, color normal PSYCH: no abnormalities of mood noted, alert and oriented to situation   ED Treatments / Results  Labs (all labs ordered are listed, but only abnormal results are displayed) Labs Reviewed  BASIC METABOLIC PANEL - Abnormal; Notable for the following components:      Result Value   Sodium 133 (*)    CO2 21 (*)    Glucose, Bld 142 (*)    Creatinine, Ser 1.19 (*)    GFR calc non Af Amer 51 (*)    GFR calc Af Amer 60 (*)    All other components within normal limits  CBC WITH DIFFERENTIAL/PLATELET    EKG  EKG Interpretation  Date/Time:  Sunday Jan 29 2019 00:37:16 EDT Ventricular Rate:  103 PR Interval:    QRS Duration: 83 QT Interval:  353 QTC Calculation: 463 R Axis:   10 Text Interpretation:  Sinus tachycardia No previous ECGs available Confirmed by Ripley Fraise [phone]) on 01/29/2019 12:42:57 AM   EKG Interpretation  Date/Time:  Sunday Jan 29 2019 04:39:24 EDT Ventricular Rate:  100 PR Interval:    QRS Duration: 82 QT Interval:  373 QTC Calculation: 482 R Axis:   14 Text Interpretation:  Sinus tachycardia RSR' in V1 or V2, right VCD or RVH Baseline wander in lead(s) V6 Partial missing lead(s): V6 No significant change since last tracing Confirmed by Ripley Fraise [phone]) on 01/29/2019 4:46:43 AM       Radiology Dg Chest 2 View  Result Date: 01/29/2019 CLINICAL DATA:  58 y/o F; episode of syncope with fall to the back. Pain of the posterior lumbar spine and pelvis. EXAM: CHEST - 2 VIEW COMPARISON:  03/09/2014 chest radiograph FINDINGS: Stable heart size and mediastinal contours are within normal limits. Both lungs are clear. The visualized skeletal structures are unremarkable. IMPRESSION: No  acute pulmonary process identified. Electronically Signed   By: Kristine Garbe M.D.   On: 01/29/2019 01:47   Dg Lumbar Spine Complete  Result Date: 01/29/2019 CLINICAL DATA:  58 year old female with fall and back pain. EXAM: LUMBAR SPINE - COMPLETE 4+ VIEW COMPARISON:  CT of the abdomen pelvis dated 01/24/2019 FINDINGS: There is no acute fracture or subluxation. Grade 1 L4-L5 anterolisthesis. Multilevel facet arthropathy. The soft tissues are grossly unremarkable. IMPRESSION: No acute/traumatic lumbar spine pathology. Electronically Signed   By: Anner Crete M.D.   On: 01/29/2019 01:49   Ct Head Wo Contrast  Result Date: 01/29/2019 CLINICAL DATA:  58 y/o F; episodes of syncope and fall. Hypotension. Speech difficulty. EXAM: CT HEAD WITHOUT CONTRAST CT CERVICAL SPINE WITHOUT CONTRAST TECHNIQUE: Multidetector CT imaging of the head and cervical spine was performed following the standard protocol without intravenous contrast. Multiplanar CT image reconstructions of the cervical spine were also generated. COMPARISON:  11/29/2015 CT of head and cervical spine. FINDINGS: CT HEAD FINDINGS Brain: No evidence of acute infarction, hemorrhage, hydrocephalus, extra-axial collection or mass lesion/mass effect. Vascular: No hyperdense vessel or unexpected calcification. Skull: Normal. Negative for fracture or focal lesion. Sinuses/Orbits: No acute finding. Other: None. CT CERVICAL SPINE FINDINGS Alignment: Straightening of cervical lordosis. C4-5 grade 1 anterolisthesis. Skull base and vertebrae: No acute fracture. No primary bone lesion or focal pathologic process. Soft tissues and spinal canal: No prevertebral fluid or swelling. No visible canal hematoma. Disc levels: Cervical spondylosis with discogenic degenerative changes greatest at C4-C6 as well as advanced left-greater-than-right facet arthropathy. Uncovertebral and facet hypertrophy encroach on the neural foramen at the right C2-3,  left-greater-than-right C3-4, bilateral C4-5, bilateral C5-6, left-greater-than-right C6-7, and left C7-T1 levels. No high-grade bony spinal canal stenosis. Upper chest: Negative. Other: Negative. IMPRESSION: 1. Stable negative CT of the head. 2. No acute fracture or dislocation of the cervical spine. 3. Cervical spondylosis with advanced left-greater-than-right multilevel facet arthropathy. Electronically Signed   By: Kristine Garbe M.D.   On: 01/29/2019 02:03   Ct Cervical Spine Wo Contrast  Result Date: 01/29/2019 CLINICAL DATA:  58 y/o F; episodes of syncope and fall. Hypotension. Speech difficulty. EXAM: CT HEAD WITHOUT CONTRAST CT CERVICAL SPINE WITHOUT CONTRAST TECHNIQUE: Multidetector CT imaging of the head and cervical spine was performed following the standard protocol without intravenous contrast. Multiplanar CT  image reconstructions of the cervical spine were also generated. COMPARISON:  11/29/2015 CT of head and cervical spine. FINDINGS: CT HEAD FINDINGS Brain: No evidence of acute infarction, hemorrhage, hydrocephalus, extra-axial collection or mass lesion/mass effect. Vascular: No hyperdense vessel or unexpected calcification. Skull: Normal. Negative for fracture or focal lesion. Sinuses/Orbits: No acute finding. Other: None. CT CERVICAL SPINE FINDINGS Alignment: Straightening of cervical lordosis. C4-5 grade 1 anterolisthesis. Skull base and vertebrae: No acute fracture. No primary bone lesion or focal pathologic process. Soft tissues and spinal canal: No prevertebral fluid or swelling. No visible canal hematoma. Disc levels: Cervical spondylosis with discogenic degenerative changes greatest at C4-C6 as well as advanced left-greater-than-right facet arthropathy. Uncovertebral and facet hypertrophy encroach on the neural foramen at the right C2-3, left-greater-than-right C3-4, bilateral C4-5, bilateral C5-6, left-greater-than-right C6-7, and left C7-T1 levels. No high-grade bony spinal  canal stenosis. Upper chest: Negative. Other: Negative. IMPRESSION: 1. Stable negative CT of the head. 2. No acute fracture or dislocation of the cervical spine. 3. Cervical spondylosis with advanced left-greater-than-right multilevel facet arthropathy. Electronically Signed   By: Kristine Garbe M.D.   On: 01/29/2019 02:03    Procedures Procedures  Medications Ordered in ED Medications  sodium chloride 0.9 % bolus 1,000 mL (0 mLs Intravenous Stopped 01/29/19 0242)  sodium chloride 0.9 % bolus 1,000 mL (0 mLs Intravenous Stopped 01/29/19 0458)     Initial Impression / Assessment and Plan / ED Course  I have reviewed the triage vital signs and the nursing notes.  Pertinent labs & imaging results that were available during my care of the patient were reviewed by me and considered in my medical decision making (see chart for details).        1:21 AM Patient was just discharged from the hospital.  She has a history of chronic pain on daily pain medicine presented with intractable vomiting.  Suspected gastroparesis. She apparently had orthostatic hypotension at home with EMS.  Imaging and labs are pending at this time. 4:58 AM There are no acute traumatic injuries on CT or x-ray. Overall patient feels improved, she has walked independently without difficulty Patient reports when she got home from her discharge from the hospital, she had very little to eat, took her pain medications including Neurontin and trazodone. Strong suspicion that these led to her drop in blood pressure.  She has since improved with IV fluids.   Patient monitored till 6 AM.  Overall improved and blood pressure is higher.  She does report onset of abdominal pain that is similar to prior episodes.  She thinks this is due to the chocolate bar that she ate while waiting in the ER She prefers to be discharged home.  She has follow-up with her PCP this week.  Final Clinical Impressions(s) / ED Diagnoses   Final  diagnoses:  Syncope and collapse  Concussion with loss of consciousness, initial encounter  Acute myofascial strain of lumbar region, initial encounter  Strain of neck muscle, initial encounter    ED Discharge Orders    None       Ripley Fraise, MD 01/29/19 [phone]

## 2019-01-29 NOTE — ED Notes (Signed)
Pt walked to the bathroom with no assistance and denied dizziness.

## 2019-01-29 NOTE — ED Triage Notes (Signed)
Pt presents d/t syncope w/ fall x 2.  Pt was d/c'd from hospital yesterday.  Pt states she was feeling okay s/p d/c.  As the da progressed pt began feeling lightheaded/dizzy.  BP in the field 70/50.  Pt states having difficulty getting words out as well as comprehension upon awakening from syncopal episode.  Pt given 500 ml's NS en route.  Pt reports being started on a new BP medication during hospital stay.  Pt in c-collar d/t neck and back pain from fall.

## 2019-01-29 NOTE — ED Notes (Signed)
Patient walked to and from restroom independently with no problem or any concerns with ambulation.

## 2020-05-16 ENCOUNTER — Other Ambulatory Visit: Payer: Self-pay

## 2020-05-16 ENCOUNTER — Emergency Department (HOSPITAL_COMMUNITY)
Admission: EM | Admit: 2020-05-16 | Discharge: 2020-05-17 | Disposition: A | Discharge status: Home / Self Care | Payer: Medicare Other | Attending: Emergency Medicine | Admitting: Emergency Medicine

## 2020-05-16 ENCOUNTER — Encounter (HOSPITAL_COMMUNITY): Payer: Self-pay | Admitting: Emergency Medicine

## 2020-05-16 DIAGNOSIS — I10 Essential (primary) hypertension: Secondary | ICD-10-CM | POA: Diagnosis not present

## 2020-05-16 DIAGNOSIS — Z79899 Other long term (current) drug therapy: Secondary | ICD-10-CM | POA: Diagnosis not present

## 2020-05-16 DIAGNOSIS — Z7984 Long term (current) use of oral hypoglycemic drugs: Secondary | ICD-10-CM | POA: Insufficient documentation

## 2020-05-16 DIAGNOSIS — E119 Type 2 diabetes mellitus without complications: Secondary | ICD-10-CM | POA: Diagnosis not present

## 2020-05-16 DIAGNOSIS — R300 Dysuria: Secondary | ICD-10-CM | POA: Diagnosis present

## 2020-05-16 DIAGNOSIS — F17210 Nicotine dependence, cigarettes, uncomplicated: Secondary | ICD-10-CM | POA: Diagnosis not present

## 2020-05-16 DIAGNOSIS — N3001 Acute cystitis with hematuria: Secondary | ICD-10-CM | POA: Insufficient documentation

## 2020-05-16 LAB — COMPREHENSIVE METABOLIC PANEL
ALT: 16 U/L (ref 0–44)
AST: 27 U/L (ref 15–41)
Albumin: 4.3 g/dL (ref 3.5–5.0)
Alkaline Phosphatase: 88 U/L (ref 38–126)
Anion gap: 15 (ref 5–15)
BUN: 8 mg/dL (ref 6–20)
CO2: 25 mmol/L (ref 22–32)
Calcium: 9.9 mg/dL (ref 8.9–10.3)
Chloride: 100 mmol/L (ref 98–111)
Creatinine, Ser: 0.68 mg/dL (ref 0.44–1.00)
GFR calc Af Amer: >60 mL/min (ref >60)
GFR calc non Af Amer: >60 mL/min (ref >60)
Glucose, Bld: 178 mg/dL — ABNORMAL HIGH (ref 70–99)
Potassium: 3.7 mmol/L (ref 3.5–5.1)
Sodium: 140 mmol/L (ref 135–145)
Total Bilirubin: 0.9 mg/dL (ref 0.3–1.2)
Total Protein: 8.3 g/dL — ABNORMAL HIGH (ref 6.5–8.1)

## 2020-05-16 LAB — CBC
HCT: 43.3 % (ref 36.0–46.0)
Hemoglobin: 14 g/dL (ref 12.0–15.0)
MCH: 29.7 pg (ref 26.0–34.0)
MCHC: 32.3 g/dL (ref 30.0–36.0)
MCV: 91.7 fL (ref 80.0–100.0)
Platelets: 309 10*3/uL (ref 150–400)
RBC: 4.72 MIL/uL (ref 3.87–5.11)
RDW: 12.1 % (ref 11.5–15.5)
WBC: 9.2 10*3/uL (ref 4.0–10.5)
nRBC: 0 % (ref 0.0–0.2)

## 2020-05-16 LAB — LIPASE, BLOOD: Lipase: 32 U/L (ref 11–51)

## 2020-05-16 LAB — I-STAT BETA HCG BLOOD, ED (MC, WL, AP ONLY): I-stat hCG, quantitative: <5 m[IU]/mL (ref <5)

## 2020-05-16 MED ORDER — ONDANSETRON 4 MG PO TBDP
4.0000 mg | ORAL_TABLET | Freq: Once | ORAL | Status: AC | PRN
  Administered 2020-05-16: 4 mg via ORAL
  Filled 2020-05-16: qty 1

## 2020-05-16 NOTE — ED Triage Notes (Signed)
Pt c/o nausea/vomiting and frequent urination x 4 days.

## 2020-05-17 ENCOUNTER — Emergency Department (HOSPITAL_COMMUNITY): Payer: Medicare Other

## 2020-05-17 LAB — URINALYSIS, ROUTINE W REFLEX MICROSCOPIC
Glucose, UA: NEGATIVE mg/dL
Ketones, ur: 80 mg/dL — AB
Leukocytes,Ua: NEGATIVE
Nitrite: NEGATIVE
Protein, ur: 100 mg/dL — AB
Specific Gravity, Urine: >1.03 — ABNORMAL HIGH (ref 1.005–1.030)
pH: 5.5 (ref 5.0–8.0)

## 2020-05-17 LAB — URINALYSIS, MICROSCOPIC (REFLEX)

## 2020-05-17 MED ORDER — ACETAMINOPHEN 325 MG PO TABS
650.0000 mg | ORAL_TABLET | Freq: Once | ORAL | Status: AC
  Administered 2020-05-17: 650 mg via ORAL
  Filled 2020-05-17: qty 2

## 2020-05-17 MED ORDER — PHENAZOPYRIDINE HCL 100 MG PO TABS
200.0000 mg | ORAL_TABLET | Freq: Once | ORAL | Status: AC
  Administered 2020-05-17: 200 mg via ORAL
  Filled 2020-05-17: qty 2

## 2020-05-17 MED ORDER — MORPHINE SULFATE (PF) 4 MG/ML IV SOLN
4.0000 mg | Freq: Once | INTRAVENOUS | Status: AC
  Administered 2020-05-17: 4 mg via INTRAVENOUS
  Filled 2020-05-17: qty 1

## 2020-05-17 MED ORDER — CEPHALEXIN 500 MG PO CAPS: 500.0000 mg | ORAL_CAPSULE | Freq: Four times a day (QID) | ORAL | 0 refills | Status: AC

## 2020-05-17 MED ORDER — ACETAMINOPHEN 325 MG PO TABS
650.0000 mg | ORAL_TABLET | Freq: Once | ORAL | Status: AC | PRN
  Administered 2020-05-17: 650 mg via ORAL
  Filled 2020-05-17: qty 2

## 2020-05-17 MED ORDER — METOCLOPRAMIDE HCL 5 MG/ML IJ SOLN
10.0000 mg | Freq: Once | INTRAMUSCULAR | Status: AC
  Administered 2020-05-17: 10 mg via INTRAVENOUS
  Filled 2020-05-17: qty 2

## 2020-05-17 MED ORDER — SODIUM CHLORIDE 0.9 % IV SOLN
1.0000 g | Freq: Once | INTRAVENOUS | Status: AC
  Administered 2020-05-17: 1 g via INTRAVENOUS
  Filled 2020-05-17: qty 10

## 2020-05-17 MED ORDER — PANTOPRAZOLE SODIUM 40 MG IV SOLR
40.0000 mg | Freq: Once | INTRAVENOUS | Status: AC
  Administered 2020-05-17: 40 mg via INTRAVENOUS
  Filled 2020-05-17: qty 40

## 2020-05-17 MED ORDER — SODIUM CHLORIDE 0.9 % IV BOLUS
1000.0000 mL | Freq: Once | INTRAVENOUS | Status: AC
  Administered 2020-05-17: 1000 mL via INTRAVENOUS

## 2020-05-17 MED ORDER — ONDANSETRON HCL 4 MG PO TABS: 4.0000 mg | ORAL_TABLET | Freq: Four times a day (QID) | ORAL | 0 refills | Status: AC

## 2020-05-17 MED ORDER — HYDROCODONE-ACETAMINOPHEN 5-325 MG PO TABS
1.0000 | ORAL_TABLET | Freq: Once | ORAL | Status: AC
  Administered 2020-05-17: 1 via ORAL
  Filled 2020-05-17: qty 1

## 2020-05-17 MED ORDER — CEFTRIAXONE SODIUM 1 G IJ SOLR: 1.0000 g | Freq: Once | INTRAMUSCULAR | Status: DC

## 2020-05-17 NOTE — ED Notes (Signed)
Patient states nausea and vomiting began Thursday morning. Patient states pain and odor with urination.

## 2020-05-17 NOTE — ED Notes (Signed)
Refusing vitals

## 2020-05-17 NOTE — Discharge Instructions (Addendum)
You were given a prescription for antibiotics. Please take the antibiotic prescription fully. You were given a prescription for zofran to help with your nausea. Please take as directed.   A culture was sent of your urine today to determine if there is any bacterial growth. If the results of the culture are positive and you require an antibiotic or a change of your prescribed antibiotic you will be contacted by the hospital. If the results are negative you will not be contacted.  Please follow up with your primary doctor within the next 5-7 days.  If you do not have a primary care provider, information for a healthcare clinic has been provided for you to make arrangements for follow up care. Please return to the ER sooner if you have any new or worsening symptoms, or if you have any of the following symptoms:  Abdominal pain that does not go away.  You have a fever.  You keep throwing up (vomiting).  The pain is felt only in portions of the abdomen. Pain in the right side could possibly be appendicitis. In an adult, pain in the left lower portion of the abdomen could be colitis or diverticulitis.  You pass bloody or black tarry stools.  There is bright red blood in the stool.  The constipation stays for more than 4 days.  There is belly (abdominal) or rectal pain.  You do not seem to be getting better.  You have any questions or concerns.

## 2020-05-17 NOTE — ED Provider Notes (Signed)
[facility] EMERGENCY DEPARTMENT Provider Note   CSN: [id number] Arrival date & time: 05/16/20  2007     History Chief Complaint  Patient presents with  . Emesis    Sheila Padilla is a 59 y.o. female.  HPI    59 year old female with a history of anxiety/depression, diabetes, hyperlipidemia, insomnia, tobacco abuse, who presents to the emergency department today for evaluation of nausea, vomiting, urinary frequency, urgency and dysuria for the last several days.  She is also complaining of left flank and suprapubic pain.  She has had UTIs in the past and states her symptoms feel similar.  She denies any abnormal vaginal bleeding, discharge or concern for STD.  Past Medical History:  Diagnosis Date  . Depression 2014   and anxiety  . Diabetes mellitus without complication ([place]) 7353  . Hyperlipidemia 2014  . Insomnia   . Tobacco abuse age 30    Patient Active Problem List   Diagnosis Date Noted  . Chronic pain 01/23/2019  . Benign essential HTN 01/23/2019  . Nausea and/or vomiting 01/23/2019  . UTI (urinary tract infection) 01/23/2019  . Insomnia   . Tobacco abuse   . Type II diabetes mellitus ([place]) 12/29/2013  . Diabetes mellitus without complication ([place]) 29/92/4268  . Hyperlipidemia 09/21/2012  . Depression 09/21/2012    Past Surgical History:  Procedure Laterality Date  . BREAST CYST EXCISION Right teen   twice in teenage years  . CHOLECYSTECTOMY  1987   open  . REPLACEMENT TOTAL KNEE Left 2017     OB History   No obstetric history on file.     No family history on file.  Social History   Tobacco Use  . Smoking status: Current Every Day Smoker    Packs/day: 0.50    Years: 38.00    Pack years: 19.00    Types: Cigarettes  . Smokeless tobacco: Never Used  Substance Use Topics  . Alcohol use: Yes  . Drug use: Not Currently    Home Medications Prior to Admission medications   Medication Sig Start Date End Date Taking?  Authorizing Provider  Truddie Crumble ULTRA-THIN LANCETS MISC Check blood glucose twice daily before meals 10/28/17   Mack Hook, MD  atorvastatin (LIPITOR) 40 MG tablet Take 1 tablet (40 mg total) by mouth daily. 10/13/17   Mack Hook, MD  Blood Glucose Monitoring Suppl (AGAMATRIX PRESTO) w/Device KIT Check blood glucose twice daily before meals 10/28/17   Mack Hook, MD  cephALEXin (KEFLEX) 500 MG capsule Take 1 capsule (500 mg total) by mouth 4 (four) times daily for 14 days. 05/17/20 05/31/20  Mertha Clyatt S, PA-C  gabapentin (NEURONTIN) 300 MG capsule Take 600 mg by mouth at bedtime.  07/07/18   [provider]  glucose blood (AGAMATRIX PRESTO TEST) test strip Check blood glucose twice daily before meals 10/28/17   Mack Hook, MD  hydrOXYzine (ATARAX/VISTARIL) 25 MG tablet Take 25 mg by mouth every 8 (eight) hours as needed for anxiety.  07/05/18   [provider]  metFORMIN (GLUCOPHAGE) 500 MG tablet TAKE 1 TABLET BY MOUTH TWICE DAILY WITH A MEAL Patient taking differently: Take 500 mg by mouth 2 (two) times daily with a meal.  07/03/18   Mack Hook, MD  metoCLOPramide (REGLAN) 10 MG tablet Take 1 tablet (10 mg total) by mouth 3 (three) times daily with meals. 01/28/19 03/29/19  [place], Suann Larry, MD  omeprazole (PRILOSEC) 40 MG capsule Take 1 capsule (40 mg total) by mouth daily. 08/08/18  01/25/19  Jacqlyn Larsen, PA-C  ondansetron (ZOFRAN) 4 MG tablet Take 1 tablet (4 mg total) by mouth every 6 (six) hours. 05/17/20   Maricela Kawahara S, PA-C  oxyCODONE-acetaminophen (PERCOCET) 7.5-325 MG tablet Take 1 tablet by mouth 3 (three) times daily.    [provider]  traZODone (DESYREL) 100 MG tablet Take 1 tablet (100 mg total) by mouth at bedtime. Patient taking differently: Take 150 mg by mouth at bedtime.  10/13/17   Mack Hook, MD    Allergies    Celecoxib  Review of Systems   Review of Systems  Constitutional: Positive for  fever.  HENT: Negative for ear pain and sore throat.   Eyes: Negative for visual disturbance.  Respiratory: Negative for cough and shortness of breath.   Cardiovascular: Negative for chest pain.  Gastrointestinal: Positive for abdominal pain, nausea and vomiting. Negative for constipation and diarrhea.  Genitourinary: Positive for dysuria, flank pain, frequency and urgency. Negative for hematuria.  Musculoskeletal: Negative for back pain.  Skin: Negative for rash.  Neurological: Negative for headaches.  All other systems reviewed and are negative.   Physical Exam Updated Vital Signs BP (!) 181/99 (BP Location: Right Arm)   Pulse (!) 107   Temp 99.6 F (37.6 C) (Oral)   Resp 18   Ht 5' 5"  (1.651 m)   Wt 64 kg   LMP  (LMP Unknown)   SpO2 96%   BMI 23.46 kg/m   Physical Exam Vitals and nursing note reviewed.  Constitutional:      General: She is not in acute distress.    Appearance: She is well-developed.  HENT:     Head: Normocephalic and atraumatic.  Eyes:     Conjunctiva/sclera: Conjunctivae normal.  Cardiovascular:     Rate and Rhythm: Regular rhythm. Tachycardia present.     Heart sounds: Normal heart sounds. No murmur heard.   Pulmonary:     Effort: Pulmonary effort is normal. No respiratory distress.     Breath sounds: Normal breath sounds. No wheezing, rhonchi or rales.  Abdominal:     General: Bowel sounds are normal.     Palpations: Abdomen is soft.     Tenderness: There is abdominal tenderness (suprapubic, RUQ). There is left CVA tenderness. There is no right CVA tenderness.  Musculoskeletal:     Cervical back: Neck supple.  Skin:    General: Skin is warm and dry.  Neurological:     Mental Status: She is alert.     ED Results / Procedures / Treatments   Labs (all labs ordered are listed, but only abnormal results are displayed) Labs Reviewed  COMPREHENSIVE METABOLIC PANEL - Abnormal; Notable for the following components:      Result Value    Glucose, Bld 178 (*)    Total Protein 8.3 (*)    All other components within normal limits  URINALYSIS, ROUTINE W REFLEX MICROSCOPIC - Abnormal; Notable for the following components:   APPearance CLOUDY (*)    Specific Gravity, Urine >1.030 (*)    Hgb urine dipstick MODERATE (*)    Bilirubin Urine SMALL (*)    Ketones, ur 80 (*)    Protein, ur 100 (*)    All other components within normal limits  URINALYSIS, MICROSCOPIC (REFLEX) - Abnormal; Notable for the following components:   Bacteria, UA MANY (*)    All other components within normal limits  URINE CULTURE  LIPASE, BLOOD  CBC  I-STAT BETA HCG BLOOD, ED ([facility], [facility], [facility] ONLY)  EKG None  Radiology CT Renal Stone Study  Result Date: 05/17/2020 CLINICAL DATA:  Flank pain suspected kidney stone, history RIGHT upper quadrant and epigastric tenderness to palpation EXAM: CT ABDOMEN AND PELVIS WITHOUT CONTRAST TECHNIQUE: Multidetector CT imaging of the abdomen and pelvis was performed following the standard protocol without IV contrast. Sagittal and coronal MPR images reconstructed from axial data set. Oral contrast not administered for this indication. COMPARISON:  01/24/2019 FINDINGS: Lower chest: Lung bases clear Hepatobiliary: Gallbladder surgically absent.  Liver unremarkable. Pancreas: Normal appearance Spleen: Normal appearance Adrenals/Urinary Tract: Adrenal glands, kidneys, ureters, and bladder normal appearance Stomach/Bowel: Normal appendix in RIGHT pelvis, best seen on coronal imaging. Low lying cecum. Stomach and bowel loops otherwise normal appearance. Vascular/Lymphatic: Aorta normal caliber.  No adenopathy. Reproductive: Uterus surgically absent. Other: No free air or free fluid. Tiny umbilical hernia containing fat. No definite inflammatory process. Musculoskeletal: Osseous demineralization. Facet degenerative changes lower lumbar spine. Minimal anterolisthesis L4-L5. IMPRESSION: No acute intra-abdominal or intrapelvic  abnormalities. Tiny umbilical hernia containing fat. Electronically Signed   By: Lavonia Dana M.D.   On: 05/17/2020 12:38    Procedures Procedures (including critical care time)  Medications Ordered in ED Medications  HYDROcodone-acetaminophen (NORCO/VICODIN) 5-325 MG per tablet 1 tablet (has no administration in time range)  cefTRIAXone (ROCEPHIN) 1 g in sodium chloride 0.9 % 100 mL IVPB (has no administration in time range)  ondansetron (ZOFRAN-ODT) disintegrating tablet 4 mg (4 mg Oral Given 05/16/20 2016)  acetaminophen (TYLENOL) tablet 650 mg (650 mg Oral Given 05/17/20 0611)  sodium chloride 0.9 % bolus 1,000 mL (0 mLs Intravenous Stopped 05/17/20 1002)  metoCLOPramide (REGLAN) injection 10 mg (10 mg Intravenous Given 05/17/20 0832)  phenazopyridine (PYRIDIUM) tablet 200 mg (200 mg Oral Given 05/17/20 0831)  morphine 4 MG/ML injection 4 mg (4 mg Intravenous Given 05/17/20 1140)  acetaminophen (TYLENOL) tablet 650 mg (650 mg Oral Given 05/17/20 1140)  sodium chloride 0.9 % bolus 1,000 mL (0 mLs Intravenous Stopped 05/17/20 1255)  pantoprazole (PROTONIX) injection 40 mg (40 mg Intravenous Given 05/17/20 1141)    ED Course  I have reviewed the triage vital signs and the nursing notes.  Pertinent labs & imaging results that were available during my care of the patient were reviewed by me and considered in my medical decision making (see chart for details).    MDM Rules/Calculators/A&P                          59 year old female with history of UTIs presenting for evaluation of suprapubic pain, left flank pain and urinary symptoms for the last several days.  Found to be febrile and tachycardic . Reviewed/interpreted labs CBC is without leukocytosis or anemia CMP with normal electrolytes, kidney and liver function Lipase negative Beta hCG negative UA with WBCs and many bacteria. Urine culture was obtained.    CT abd/pelvis - No acute intra-abdominal or intrapelvic abnormalities. Tiny  umbilical hernia containing fat  pt states sxs feel very similar to prior UTI. UA does have some WBC and many bacteria. Will tx for possible pyelo and give ceftriaxone. I did offer to complete pelvic exam and STD cultures however she declines. She does not have a uterus therefore doubt PID. Given rx for keflex and zofran. She is tolerating po in the ED. Advised on f/u and specific return precautions. She voices understanding of the plan and reasons to return. All questions answered, pt stable for discharge.   Final Clinical Impression(s) /  ED Diagnoses Final diagnoses:  Acute cystitis with hematuria    Rx / DC Orders ED Discharge Orders         Ordered    cephALEXin (KEFLEX) 500 MG capsule  4 times daily        05/17/20 1253    ondansetron (ZOFRAN) 4 MG tablet  Every 6 hours        05/17/20 1254           Diandre Merica S, PA-C 05/17/20 1301    Quintella Reichert, MD 05/17/20 1340

## 2020-05-17 NOTE — ED Notes (Signed)
Transported to CT 

## 2020-05-17 NOTE — ED Notes (Signed)
Patient returned from CT

## 2020-05-19 LAB — URINE CULTURE: Culture: >=100000 — AB

## 2020-05-20 ENCOUNTER — Telehealth: Payer: Self-pay | Admitting: *Deleted

## 2020-05-20 NOTE — Telephone Encounter (Signed)
Post ED Visit - Positive Culture Follow-up  Culture report reviewed by antimicrobial stewardship pharmacist: [facility] []  , Pharm.D. []  Enzo Bi, Pharm.D., BCPS AQ-ID []  , Pharm.D., BCPS []  Celedonio Miyamoto, Pharm.D., BCPS []  [place], Garvin Fila.D., BCPS, AAHIVP []  , Pharm.D., BCPS, AAHIVP []  Georgina Pillion, PharmD, BCPS []  , PharmD, BCPS []  [place], PharmD, BCPS []  [place], PharmD []  , PharmD, BCPS []  Estella Husk, PharmD  Pharmacy Team []  Lysle Pearl, PharmD []  , PharmD []  Phillips Climes, PharmD []  , Rph []  Agapito Games) , PharmD []  Verlan Friends, PharmD []  , PharmD []  Mervyn Gay, PharmD []  , PharmD []  Vinnie Level, PharmD []  [facility], PharmD []  , PharmD []  Len Childs, PharmD   Positive urine culture Treated with Cephalexin, organism sensitive to the same and no further patient follow-up is required at this time, , PharmD  Greer Pickerel 05/20/2020, 4:19 PM

## 2022-01-15 IMAGING — CT CT RENAL STONE PROTOCOL
2 of 4 series · 17 of 46 positions shown, 19 images · non-contrast
Comparison: 01/24/2019

CLINICAL DATA: Flank pain suspected kidney stone, history RIGHT
upper quadrant and epigastric tenderness to palpation

EXAM:
CT ABDOMEN AND PELVIS WITHOUT CONTRAST
TECHNIQUE: Multidetector CT imaging of the abdomen and pelvis was performed
following the standard protocol without IV contrast. Sagittal and
coronal MPR images reconstructed from axial data set. Oral contrast
not administered for this indication.

[Series 3: renal stone 5.0 · axial · 0.75mm/px · z∈[-553,-113]mm · 14 of 96 slices shown, 16 images]
[im 4/96  soft-tissue]
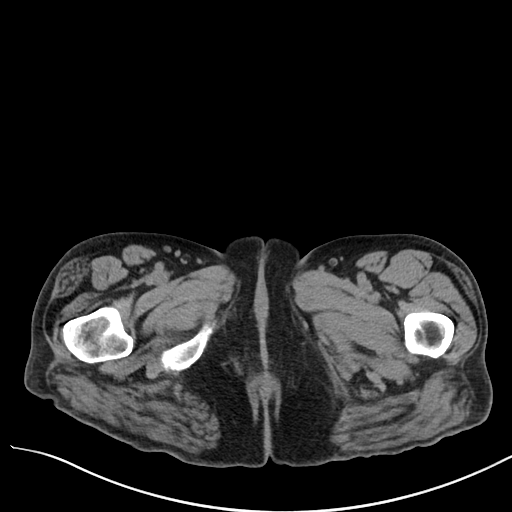
[im 4/96  bone]
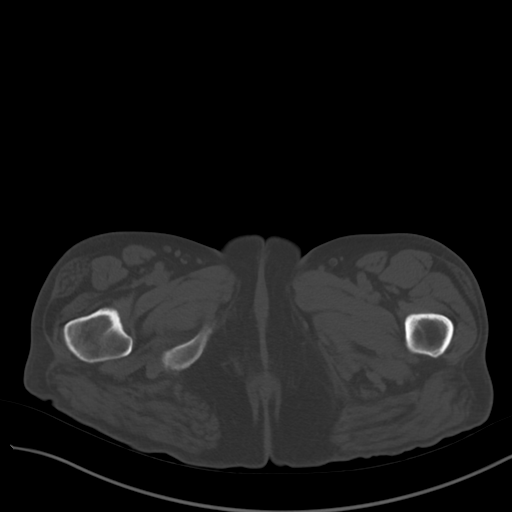
[im 12/96  soft-tissue]
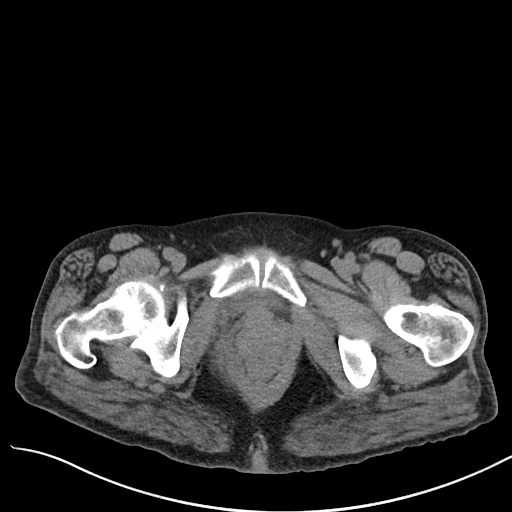
[im 20/96  soft-tissue]
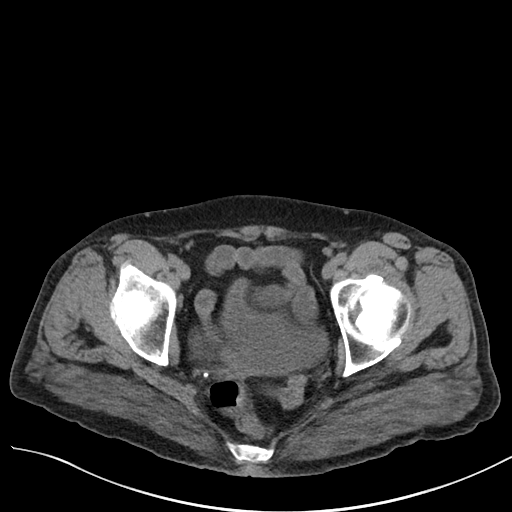
[im 27/96  soft-tissue]
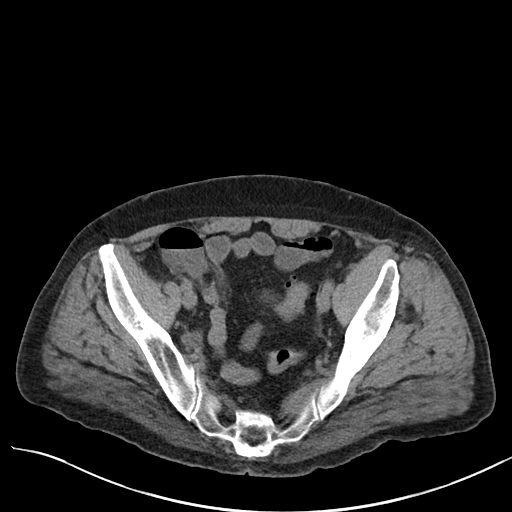
[im 31/96  soft-tissue]
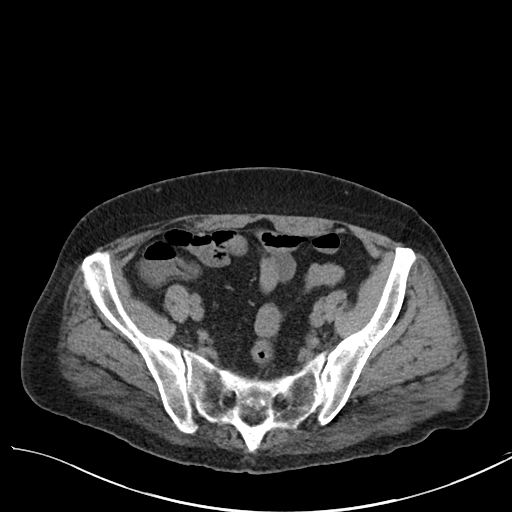
[im 39/96  soft-tissue]
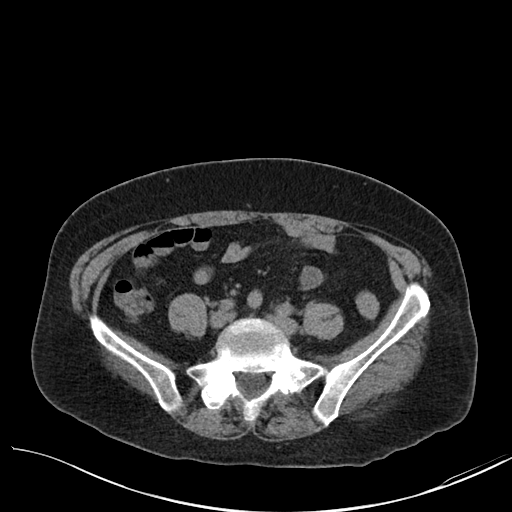
[im 46/96  soft-tissue]
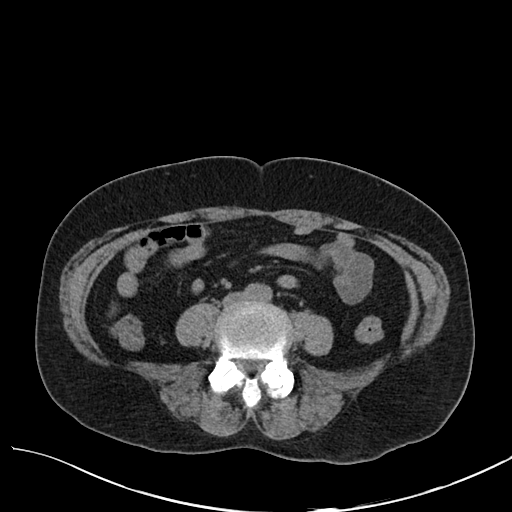
[im 50/96  soft-tissue]
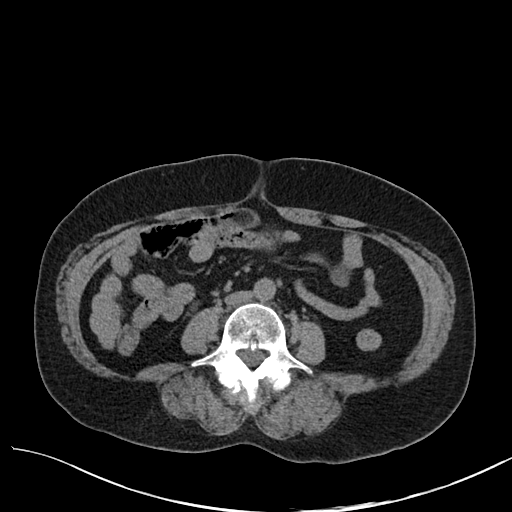
[im 58/96  soft-tissue]
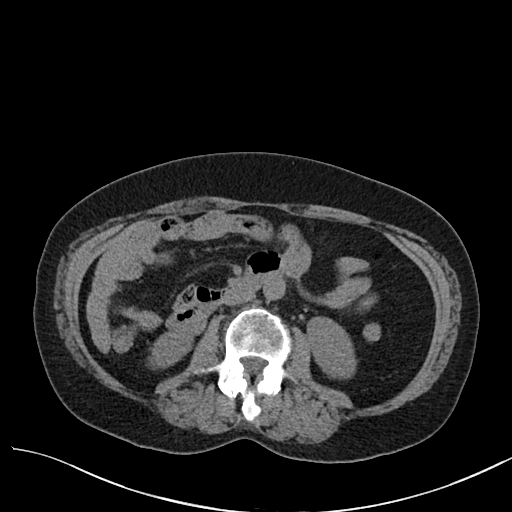
[im 58/96  bone]
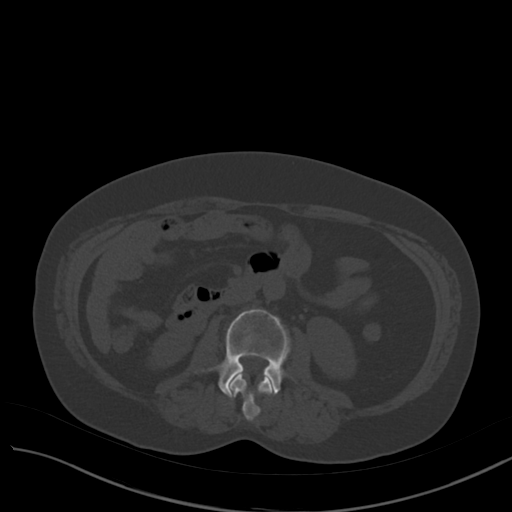
[im 65/96  soft-tissue]
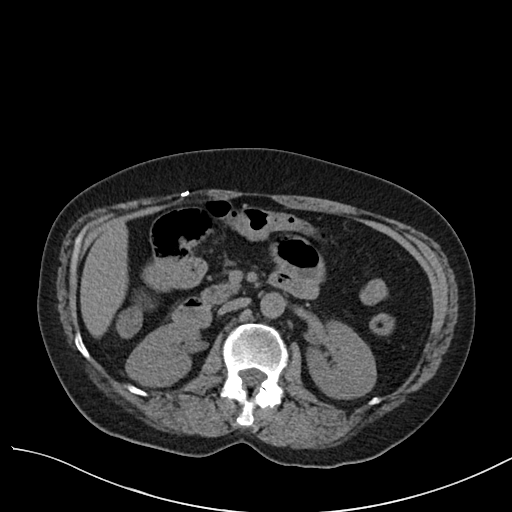
[im 73/96  soft-tissue]
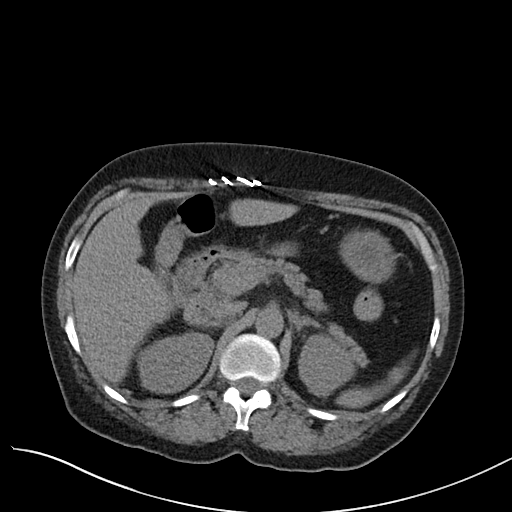
[im 77/96  soft-tissue]
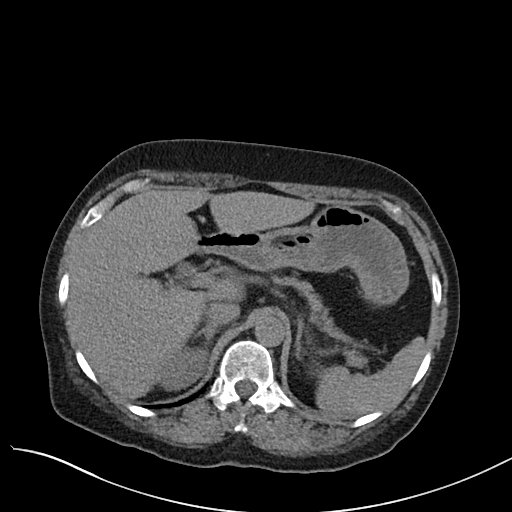
[im 84/96  soft-tissue]
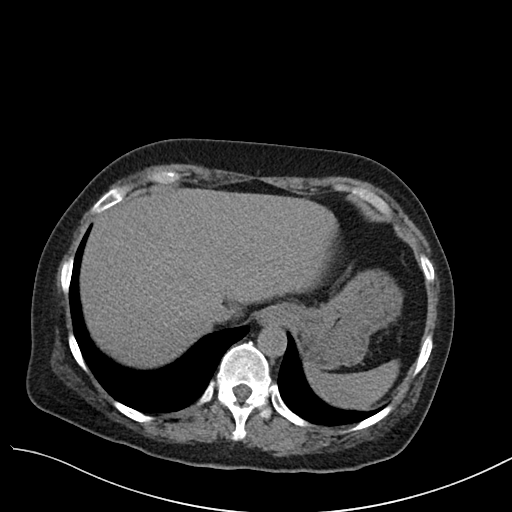
[im 92/96  soft-tissue]
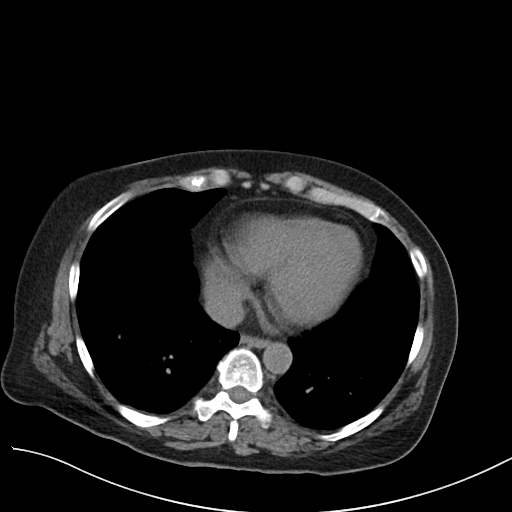

[Series 6: coronal · coronal · 0.87mm/px · 3 of 99 slices shown]
[im 33/99  soft-tissue]
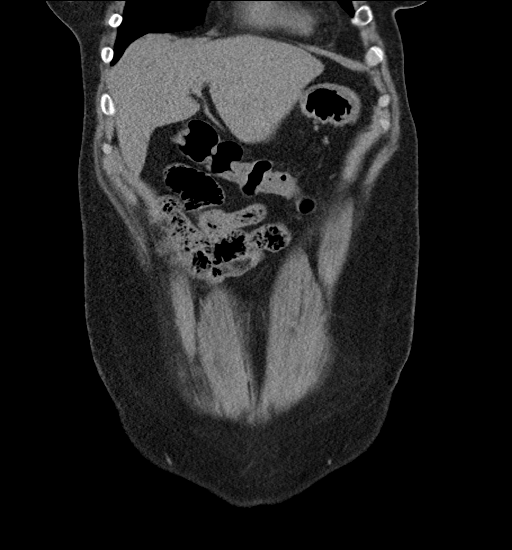
[im 44/99  soft-tissue]
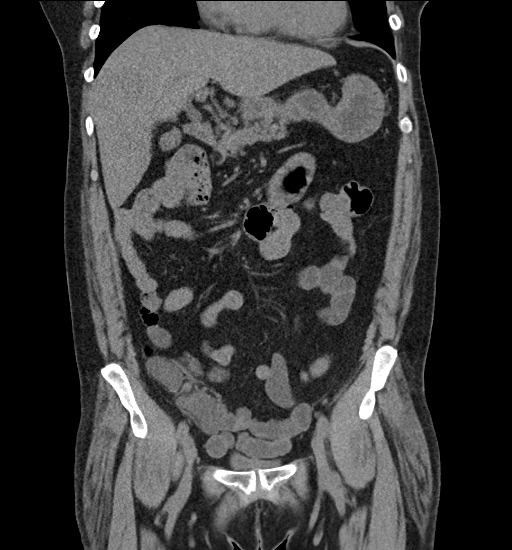
[im 55/99  soft-tissue]
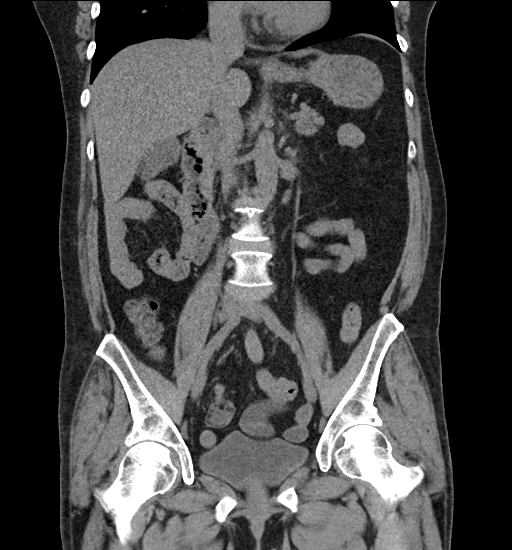

[17 of 46 positions shown; findings below may reference images not displayed]

FINDINGS: Lower chest: Lung bases clear

Hepatobiliary: Gallbladder surgically absent.  Liver unremarkable.

Pancreas: Normal appearance

Spleen: Normal appearance

Adrenals/Urinary Tract: Adrenal glands, kidneys, ureters, and
bladder normal appearance

Stomach/Bowel: Normal appendix in RIGHT pelvis, best seen on coronal
imaging. Low lying cecum. Stomach and bowel loops otherwise normal
appearance.

Vascular/Lymphatic: Aorta normal caliber.  No adenopathy.

Reproductive: Uterus surgically absent.

Other: No free air or free fluid. Tiny umbilical hernia containing
fat. No definite inflammatory process.

Musculoskeletal: Osseous demineralization. Facet degenerative
changes lower lumbar spine. Minimal anterolisthesis L4-L5.
IMPRESSION: No acute intra-abdominal or intrapelvic abnormalities.

Tiny umbilical hernia containing fat.
